# Patient Record
Sex: Female | Born: 1955 | Race: White | Hispanic: No | Marital: Single | Smoking: Former smoker
Health system: Southern US, Community
[De-identification: ages and names within clinical notes are randomized; demographics above are authoritative.]

## PROBLEM LIST (undated history)

## (undated) DIAGNOSIS — Z86010 Personal history of colonic polyps: Secondary | ICD-10-CM

## (undated) DIAGNOSIS — A6000 Herpesviral infection of urogenital system, unspecified: Secondary | ICD-10-CM

## (undated) DIAGNOSIS — M1990 Unspecified osteoarthritis, unspecified site: Secondary | ICD-10-CM

## (undated) DIAGNOSIS — D219 Benign neoplasm of connective and other soft tissue, unspecified: Secondary | ICD-10-CM

## (undated) DIAGNOSIS — Z9889 Other specified postprocedural states: Secondary | ICD-10-CM

## (undated) DIAGNOSIS — K219 Gastro-esophageal reflux disease without esophagitis: Secondary | ICD-10-CM

## (undated) HISTORY — PX: COLONOSCOPY: SHX174

## (undated) HISTORY — DX: Other specified postprocedural states: Z98.890

## (undated) HISTORY — DX: Benign neoplasm of connective and other soft tissue, unspecified: D21.9

## (undated) HISTORY — PX: OTHER SURGICAL HISTORY: SHX169

## (undated) HISTORY — DX: Gastro-esophageal reflux disease without esophagitis: K21.9

## (undated) HISTORY — DX: Herpesviral infection of urogenital system, unspecified: A60.00

## (undated) HISTORY — DX: Unspecified osteoarthritis, unspecified site: M19.90

## (undated) HISTORY — DX: Personal history of colonic polyps: Z86.010

---

## 1990-05-05 HISTORY — PX: OTHER SURGICAL HISTORY: SHX169

## 2005-08-20 ENCOUNTER — Other Ambulatory Visit: Admission: RE | Admit: 2005-08-20 | Discharge: 2005-08-20 | Payer: Self-pay | Admitting: Obstetrics and Gynecology

## 2006-05-05 DIAGNOSIS — Z86010 Personal history of colonic polyps: Secondary | ICD-10-CM

## 2006-05-05 DIAGNOSIS — Z9889 Other specified postprocedural states: Secondary | ICD-10-CM

## 2006-05-05 HISTORY — DX: Other specified postprocedural states: Z98.890

## 2006-05-05 HISTORY — DX: Personal history of colonic polyps: Z86.010

## 2006-07-06 ENCOUNTER — Encounter: Admission: RE | Admit: 2006-07-06 | Discharge: 2006-07-06 | Payer: Self-pay | Admitting: Obstetrics and Gynecology

## 2006-08-24 ENCOUNTER — Other Ambulatory Visit: Admission: RE | Admit: 2006-08-24 | Discharge: 2006-08-24 | Payer: Self-pay | Admitting: Obstetrics and Gynecology

## 2007-09-16 ENCOUNTER — Other Ambulatory Visit: Admission: RE | Admit: 2007-09-16 | Discharge: 2007-09-16 | Payer: Self-pay | Admitting: Gynecology

## 2008-05-05 HISTORY — PX: STAPEDES SURGERY: SHX789

## 2008-09-12 ENCOUNTER — Encounter: Admission: RE | Admit: 2008-09-12 | Discharge: 2008-09-12 | Payer: Self-pay | Admitting: Family Medicine

## 2009-06-21 ENCOUNTER — Other Ambulatory Visit: Admission: RE | Admit: 2009-06-21 | Discharge: 2009-06-21 | Payer: Self-pay | Admitting: Gynecology

## 2009-06-21 ENCOUNTER — Ambulatory Visit: Payer: Self-pay | Admitting: Women's Health

## 2009-06-26 ENCOUNTER — Encounter: Admission: RE | Admit: 2009-06-26 | Discharge: 2009-06-26 | Payer: Self-pay | Admitting: Obstetrics and Gynecology

## 2009-07-03 ENCOUNTER — Encounter: Payer: Self-pay | Admitting: Internal Medicine

## 2009-07-03 LAB — CONVERTED CEMR LAB

## 2009-07-30 ENCOUNTER — Ambulatory Visit: Payer: Self-pay | Admitting: Women's Health

## 2009-07-30 ENCOUNTER — Other Ambulatory Visit: Admission: RE | Admit: 2009-07-30 | Discharge: 2009-07-30 | Payer: Self-pay | Admitting: Gynecology

## 2009-10-29 ENCOUNTER — Ambulatory Visit: Payer: Self-pay | Admitting: Gynecology

## 2010-04-12 ENCOUNTER — Ambulatory Visit: Payer: Self-pay | Admitting: Women's Health

## 2010-05-31 ENCOUNTER — Ambulatory Visit
Admission: RE | Admit: 2010-05-31 | Discharge: 2010-05-31 | Payer: Self-pay | Source: Home / Self Care | Attending: Women's Health | Admitting: Women's Health

## 2010-06-05 ENCOUNTER — Ambulatory Visit (INDEPENDENT_AMBULATORY_CARE_PROVIDER_SITE_OTHER): Payer: BC Managed Care – PPO | Admitting: Internal Medicine

## 2010-06-05 ENCOUNTER — Encounter: Payer: Self-pay | Admitting: Internal Medicine

## 2010-06-05 DIAGNOSIS — M545 Low back pain, unspecified: Secondary | ICD-10-CM

## 2010-06-05 DIAGNOSIS — B009 Herpesviral infection, unspecified: Secondary | ICD-10-CM

## 2010-06-05 DIAGNOSIS — K219 Gastro-esophageal reflux disease without esophagitis: Secondary | ICD-10-CM | POA: Insufficient documentation

## 2010-06-05 DIAGNOSIS — Z9189 Other specified personal risk factors, not elsewhere classified: Secondary | ICD-10-CM | POA: Insufficient documentation

## 2010-06-12 NOTE — Assessment & Plan Note (Signed)
Summary: new pt/bcbs/#/lb   Vital Signs:  Patient profile:   56 year old female Height:      64.5 inches (163.83 cm) Weight:      156.12 pounds (70.96 kg) BMI:     26.48 O2 Sat:      97 % on Room air Temp:     97.8 degrees F (36.56 degrees C) oral Pulse rate:   67 / minute BP sitting:   122 / 70  (left arm) Cuff size:   regular  Vitals Entered By: Orlan Leavens RMA (June 05, 2010 2:01 PM)  O2 Flow:  Room air CC: New patient, Back pain Is Patient Diabetic? No Pain Assessment Patient in pain? no        Primary Care Provider:  Newt Lukes MD  CC:  New patient and Back pain.  History of Present Illness: new pt to me and our practice, here to est care - prev with dr. bland   Back Pain      This is a 55 year old woman who presents with Back pain.  The symptoms began 1 week ago.  The intensity is described as moderate.  no injury or trauma precipitating pain. hx same related.  The patient denies fever, chills, weakness, loss of sensation, fecal incontinence, urinary incontinence, dysuria, rest pain, inability to work, and inability to care for self.  The pain is located in the right low back.  The pain began at work and suddenly.  The pain radiates to the right buttock.  The pain is made worse by lying down.  The pain is made better by activity, NSAID medications, and heat.    Preventive Screening-Counseling & Management  Alcohol-Tobacco     Alcohol drinks/day: <1     Alcohol Counseling: not indicated; use of alcohol is not excessive or problematic     Smoking Status: quit     Tobacco Counseling: to remain off tobacco products  Caffeine-Diet-Exercise     Does Patient Exercise: no     Exercise Counseling: to improve exercise regimen     Depression Counseling: not indicated; screening negative for depression  Safety-Violence-Falls     Seat Belt Counseling: not indicated; patient wears seat belts     Firearm Counseling: not applicable     Violence Counseling: not  indicated; no violence risk noted     Fall Risk Counseling: not indicated; no significant falls noted  Clinical Review Panels:  Prevention   Last Mammogram:  Location: [facility].   Impression: 1. Probable post-biopsy scarring in the  left breast 2:30 position 5cm from the nipple identified on U/S. A diagnostic left mammogram and left breast u/s in 6 months is recommend to confirm this benign impression: 2. No evidence of malignacy is identified BI-RADS 3 (06/26/2009)   Last Pap Smear:  Interpretation Result:Negative for intraepithelial Lesion or Malignancy.    (07/03/2009)   Current Medications (verified): 1)  Valtrex 500 Mg Tabs (Valacyclovir Hcl) .... Take 1 By Mouth Two Times A Day  Allergies (verified): 1)  ! Codeine  Past History:  Past Medical History: GERD HSV2  MD roster: gyn - fernandez/young  Past Surgical History: Caesarean section [phone]) Breast biopsy (2005) Broken (L) leg put rods & pins in  Family History: Family History Diabetes 1st degree relative (grandmother) Family History Hypertension (mom) Family History of Stroke M 1st degree relative <50 (grandfather) Family History High cholesterol (sister) Heart disease (both parent)   Social History: Former Smoker - quit  1992 single, lives with dtr originally from [place]   Smoking Status:  quit Does Patient Exercise:  no  Review of Systems  The patient denies abdominal pain, hematuria, genital sores, muscle weakness, suspicious skin lesions, difficulty walking, and depression.    Physical Exam  General:  alert, well-developed, well-nourished, and cooperative to examination.    Lungs:  normal respiratory effort, no intercostal retractions or use of accessory muscles; normal breath sounds bilaterally - no crackles and no wheezes.    Heart:  normal rate, regular rhythm, no murmur, and no rub. BLE without edema.  Msk:  back: full range of motion of lumbar spine. Nontender to  palpation. Negative straight leg raise. Deep tendon reflexes symmetrically intactand sensation intact throughout all dermatomes in bilateral lower extremities. Full strength to manual muscle testing.  Able to heel and toe walk without difficulty and ambulates with a normal gait.    Impression & Recommendations:  Problem # 1:  BACK PAIN, LUMBAR (ICD-724.2)  suspect muscle starin - improving -  cont otc nsaid but take scheduled for next 5 days - to call for xray or rx med if not improved, sooner if worse (declines imaging or other meds today since better than onset last week) Her updated medication list for this problem includes:    Ibuprofen 200 Mg Tabs (Ibuprofen) .Marland Kitchen... 2 by mouth two times a day x 5 days, then as needed for pain  Discussed use of moist heat or ice, modified activities, medications, and stretching/strengthening exercises. Back care instructions given. To be seen in 2 weeks if no improvement; sooner if worsening of symptoms.   Problem # 2:  HSV (ICD-054.9)  valtrex as needed as per gyn - no current or recent outbreaks/flares  Complete Medication List: 1)  Valtrex 500 Mg Tabs (Valacyclovir hcl) .... Take 1 by mouth two times a day 2)  Ibuprofen 200 Mg Tabs (Ibuprofen) .... 2 by mouth two times a day x 5 days, then as needed for pain  Patient Instructions: 1)  it was good to see you today. 2)  will send for records from dr. bland to review - 3)  use over the counter ibuprofen for back strain as discussed - 4)  call if pain not improved in next 10-14days, sooner if worse 5)  Please schedule a follow-up appointment in next 2-6 months for physical and labs, call sooner if problems.    Orders Added: 1)  New Patient Level II [99202]     Pap Smear  Procedure date:  07/03/2009  Findings:      Interpretation Result:Negative for intraepithelial Lesion or Malignancy.

## 2010-07-03 ENCOUNTER — Ambulatory Visit (INDEPENDENT_AMBULATORY_CARE_PROVIDER_SITE_OTHER): Payer: Self-pay

## 2010-07-03 DIAGNOSIS — K5900 Constipation, unspecified: Secondary | ICD-10-CM

## 2010-07-17 ENCOUNTER — Ambulatory Visit (INDEPENDENT_AMBULATORY_CARE_PROVIDER_SITE_OTHER): Payer: Self-pay

## 2010-07-17 DIAGNOSIS — K5900 Constipation, unspecified: Secondary | ICD-10-CM

## 2010-08-14 ENCOUNTER — Ambulatory Visit (INDEPENDENT_AMBULATORY_CARE_PROVIDER_SITE_OTHER): Payer: Self-pay

## 2010-08-14 DIAGNOSIS — K5900 Constipation, unspecified: Secondary | ICD-10-CM

## 2010-09-11 ENCOUNTER — Ambulatory Visit (INDEPENDENT_AMBULATORY_CARE_PROVIDER_SITE_OTHER): Payer: Self-pay

## 2010-09-11 ENCOUNTER — Ambulatory Visit: Payer: Self-pay

## 2010-09-11 DIAGNOSIS — K5900 Constipation, unspecified: Secondary | ICD-10-CM

## 2010-10-09 ENCOUNTER — Ambulatory Visit (INDEPENDENT_AMBULATORY_CARE_PROVIDER_SITE_OTHER): Payer: Self-pay

## 2010-10-09 DIAGNOSIS — K5900 Constipation, unspecified: Secondary | ICD-10-CM

## 2010-10-22 ENCOUNTER — Ambulatory Visit (INDEPENDENT_AMBULATORY_CARE_PROVIDER_SITE_OTHER): Payer: Self-pay

## 2010-10-22 DIAGNOSIS — K5900 Constipation, unspecified: Secondary | ICD-10-CM

## 2010-11-21 ENCOUNTER — Encounter: Payer: Self-pay | Admitting: *Deleted

## 2010-11-27 ENCOUNTER — Other Ambulatory Visit (HOSPITAL_COMMUNITY)
Admission: RE | Admit: 2010-11-27 | Discharge: 2010-11-27 | Disposition: A | Discharge status: Home / Self Care | Payer: BC Managed Care – PPO | Source: Ambulatory Visit | Attending: Gynecology | Admitting: Gynecology

## 2010-11-27 ENCOUNTER — Encounter: Payer: Self-pay | Admitting: Women's Health

## 2010-11-27 ENCOUNTER — Ambulatory Visit (INDEPENDENT_AMBULATORY_CARE_PROVIDER_SITE_OTHER): Payer: BC Managed Care – PPO | Admitting: Women's Health

## 2010-11-27 VITALS — BP 130/70 | Ht 65.25 in | Wt 156.0 lb

## 2010-11-27 DIAGNOSIS — Z01419 Encounter for gynecological examination (general) (routine) without abnormal findings: Secondary | ICD-10-CM

## 2010-11-27 DIAGNOSIS — N951 Menopausal and female climacteric states: Secondary | ICD-10-CM

## 2010-11-27 NOTE — Progress Notes (Signed)
Samantha Webb, has this patient been scheduled for colonoscopy? FOBT at office visit?

## 2010-11-27 NOTE — Progress Notes (Addendum)
Samantha Webb 02/24/1956 [id number]    History:    The patient presents for annual exam.  55 year old presents for annual exam without complaint. She has been without a period now for greater than 7 months. With an elevated FSH. Past surgical , medical  and social history were all reviewed and documented in the EPIC chart.   ROS:  A 14 point ROS was performed and pertinent positives and negatives are included in the history.  Exam:  Filed Vitals:   11/27/10 1012  BP: 130/70    General appearance:  Normal Head/Neck:  Normal, without cervical or supraclavicular adenopathy. Thyroid:  Symmetrical, normal in size, without palpable masses or nodularity. Respiratory  Effort:  Normal  Auscultation:  Clear without wheezing or rhonchi Cardiovascular  Auscultation:  Regular rate, without rubs, murmurs or gallops  Edema/varicosities:  Not grossly evident Abdominal  Masses/tenderness:  Soft,nontender, without masses, guarding or rebound.  Liver/spleen:  No organomegaly noted  Hernia:  None appreciated  Occult test:   Skin  Inspection:  Grossly normal  Palpation:  Grossly normal Neurologic/psychiatric  Orientation:  Normal with appropriate conversation.  Mood/affect:  Normal  Genitourinary with chaperone present    Breasts: Examined lying and sitting.     Right: Without masses, retractions,         discharge or axillary adenopathy.     Left: Without masses, retractions,         discharge or axillary adenopathy.   Inguinal/mons:  Normal without inguinal adenopathy  External genitalia:  Normal  BUS/Urethra/Skene's glands:  Normal  Bladder:  Normal  Vagina:  Normal  Cervix:  Normal  Uterus:  retroverted, enlarged in size/fibroids to 10 wk size.  Midline and mobile  Adnexa/parametria:     Rt: Without masses or tenderness.   Lt: Without masses or tenderness.  Anus and perineum: Normal  Digital rectal exam: Normal sphincter tone without palpated masses or        tenderness.   Assessment/Plan:  55 y.o. year old female for annual exam.   Postmenopausal with some symptoms of hot flushes. She declines HRT therapy did review slight risk for blood clots strokes rest cancer with HRT. She declines need at this time. Will schedule a bone density here in our office. Encouraged to continue calcium rich foods vitamin D 2000 daily encouraged. She is doing Pilates and regular exercise and will continue. SBE is encouraged report changes. She has a mammogram scheduled next week and she will keep. Mammogram is overdue and she is aware of the importance of annual screen. She has had numerous labs at her primary care and she states her glucose lipid profile CBC were all normal. UA and Pap only today history of all normal Paps. Had a colonoscopy in 2008 when 51 with one benign polyp.  Also participated in constipation study this year, had neg blood in stool and all normal labs.  Was instructed to increase fluids and fiber rich foods.      Harrington Challenger MD, 11:16 AM 11/27/2010

## 2010-11-29 ENCOUNTER — Encounter: Payer: Self-pay | Admitting: Women's Health

## 2011-02-17 ENCOUNTER — Other Ambulatory Visit: Payer: Self-pay | Admitting: *Deleted

## 2011-02-17 DIAGNOSIS — L905 Scar conditions and fibrosis of skin: Secondary | ICD-10-CM

## 2011-03-03 ENCOUNTER — Other Ambulatory Visit: Payer: BC Managed Care – PPO

## 2011-03-05 ENCOUNTER — Ambulatory Visit
Admission: RE | Admit: 2011-03-05 | Discharge: 2011-03-05 | Disposition: A | Discharge status: Home / Self Care | Payer: BC Managed Care – PPO | Source: Ambulatory Visit | Attending: Obstetrics and Gynecology | Admitting: Obstetrics and Gynecology

## 2011-03-05 DIAGNOSIS — L905 Scar conditions and fibrosis of skin: Secondary | ICD-10-CM

## 2011-05-02 ENCOUNTER — Encounter: Payer: Self-pay | Admitting: Internal Medicine

## 2011-05-02 ENCOUNTER — Ambulatory Visit (INDEPENDENT_AMBULATORY_CARE_PROVIDER_SITE_OTHER): Payer: BC Managed Care – PPO | Admitting: Internal Medicine

## 2011-05-02 VITALS — BP 110/82 | HR 65 | Temp 98.7°F

## 2011-05-02 DIAGNOSIS — B351 Tinea unguium: Secondary | ICD-10-CM

## 2011-05-02 MED ORDER — CICLOPIROX 8 % EX SOLN: Freq: Every day | CUTANEOUS | Status: DC

## 2011-05-02 NOTE — Patient Instructions (Signed)
It was good to see you today. Try Penlac polish for your nail condition - also use tea tree oil and give it time! Your prescription(s) have been submitted to your pharmacy. Please take as directed and contact our office if you believe you are having problem(s) with the medication(s). If still problems, call a podiatrist for consideration of nail removal

## 2011-05-02 NOTE — Progress Notes (Signed)
  Subjective:    Patient ID: Samantha Webb, female    DOB: 12-Oct-1955, 55 y.o.   MRN: [id number]  HPI complains of toenail fungus Ongoing greater than one year Home treatment with applesauce or vinegar soaks have been partially effective no pain, no trauma or drainage  Past Medical History  Diagnosis Date  . Fibroid   . History of colonoscopy with polypectomy 2008    benign polyp  . Herpesvirus 2   . GERD (gastroesophageal reflux disease)      Review of Systems Musculoskeletal: no joint swelling Skin: No rash or bruising    Objective:   Physical Exam BP 110/82  Pulse 65  Temp(Src) 98.7 F (37.1 C) (Oral)  SpO2 97% General: No acute distress Skin: Left great tunnel with fungal changes on medial edge including partial nail removal not involving base - no evidence of ingrown nail or skin infection - other nails unaffected  No results found for this basename: WBC, HGB, HCT, PLT, GLUCOSE, CHOL, TRIG, HDL, LDLDIRECT, LDLCALC, ALT, AST, NA, K, CL, CREATININE, BUN, CO2, TSH, PSA, INR, GLUF, HGBA1C, MICROALBUR        Assessment & Plan:  Onychomycosis, left great nail - discussed risk and benefit of systemic antifungal and instead will try topical therapies - Penlac and tea tree oil - education on diagnosis provided. If continued symptoms over next 12-24 months which patient desires resolution, will call podiatrist for consideration of partial nail removal

## 2011-08-29 ENCOUNTER — Encounter: Payer: Self-pay | Admitting: Internal Medicine

## 2011-08-29 ENCOUNTER — Ambulatory Visit (INDEPENDENT_AMBULATORY_CARE_PROVIDER_SITE_OTHER): Payer: BC Managed Care – PPO | Admitting: Internal Medicine

## 2011-08-29 ENCOUNTER — Other Ambulatory Visit (INDEPENDENT_AMBULATORY_CARE_PROVIDER_SITE_OTHER): Payer: BC Managed Care – PPO

## 2011-08-29 VITALS — BP 112/72 | HR 66 | Temp 97.6°F | Ht 64.5 in | Wt 163.8 lb

## 2011-08-29 DIAGNOSIS — R109 Unspecified abdominal pain: Secondary | ICD-10-CM

## 2011-08-29 DIAGNOSIS — R1011 Right upper quadrant pain: Secondary | ICD-10-CM

## 2011-08-29 DIAGNOSIS — M545 Low back pain, unspecified: Secondary | ICD-10-CM

## 2011-08-29 LAB — CBC WITH DIFFERENTIAL/PLATELET
HCT: 37.3 % (ref 36.0–46.0)
Hemoglobin: 12.6 g/dL (ref 12.0–15.0)
MCV: 89.7 fl (ref 78.0–100.0)
Neutro Abs: 4.6 10*3/uL (ref 1.4–7.7)
Neutrophils Relative %: 73.3 % (ref 43.0–77.0)
Platelets: 163 10*3/uL (ref 150.0–400.0)

## 2011-08-29 LAB — URINALYSIS, ROUTINE W REFLEX MICROSCOPIC
Leukocytes, UA: NEGATIVE
Nitrite: NEGATIVE
Specific Gravity, Urine: <=1.005 (ref 1.000–1.030)
Urine Glucose: NEGATIVE
Urobilinogen, UA: 0.2 (ref 0.0–1.0)

## 2011-08-29 LAB — BASIC METABOLIC PANEL
BUN: 14 mg/dL (ref 6–23)
CO2: 28 mEq/L (ref 19–32)
GFR: 100.78 mL/min (ref >60.00)
Potassium: 3.5 mEq/L (ref 3.5–5.1)

## 2011-08-29 LAB — HEPATIC FUNCTION PANEL
AST: 23 U/L (ref 0–37)
Alkaline Phosphatase: 72 U/L (ref 39–117)
Bilirubin, Direct: 0.1 mg/dL (ref 0.0–0.3)
Total Bilirubin: 0.4 mg/dL (ref 0.3–1.2)
Total Protein: 7 g/dL (ref 6.0–8.3)

## 2011-08-29 MED ORDER — CYCLOBENZAPRINE HCL 5 MG PO TABS: 5.0000 mg | ORAL_TABLET | Freq: Three times a day (TID) | ORAL | Status: AC | PRN

## 2011-08-29 NOTE — Patient Instructions (Signed)
Take all new medications as prescribed Continue all other medications as before Please go to LAB in the Basement for the blood and/or urine tests to be done today You will be contacted regarding the referral for: ultrasound You will be contacted by phone if any changes need to be made immediately.  Otherwise, you will receive a letter about your results with an explanation.

## 2011-08-30 ENCOUNTER — Encounter: Payer: Self-pay | Admitting: Internal Medicine

## 2011-08-30 NOTE — Assessment & Plan Note (Addendum)
Cant r/o GB issue - for abd u/s and labs,  to f/u any worsening symptoms or concerns

## 2011-08-30 NOTE — Assessment & Plan Note (Signed)
Incidental recurrent, but suspect separate issue, and stable,  to f/u any worsening symptoms or concerns

## 2011-08-30 NOTE — Assessment & Plan Note (Signed)
Cant r/o GB but suspect posible MSK - for flexeril prn

## 2011-08-30 NOTE — Progress Notes (Signed)
  Subjective:    Patient ID: Samantha Webb, female    DOB: [birth year], 56 y.o.   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  Did also have an episode of nausea last wk with dizziness 2 days ago but none since and Denies worsening reflux, dysphagia, other abd pain, vomiting, bowel change or blood.  Pt denies other chest pain, increased sob or doe, wheezing, orthopnea, PND, increased LE swelling, palpitations, dizziness or syncope.  Is s/p LLE pins/rods surgury after accident with chronic mild swelling but no worse.  Pt denies new neurological symptoms such as new headache, or facial or extremity weakness or numbness   Pt denies polydipsia, polyuria. Does incidnetly have also recurrent right lower back pain without change in severity or freq but has occas RLE distal pain and numbness, no weakness, bowel or bladder change, fever, wt loss, gait change or falls. Past Medical History  Diagnosis Date  . Fibroid   . History of colonoscopy with polypectomy 2008    benign polyp  . Herpesvirus 2   . GERD (gastroesophageal reflux disease)    Past Surgical History  Procedure Date  . Fractured leg surgery   . Cesarean section 1992  . Umbillical hernia  1992  . Stapedes surgery 2010    reports that she has never smoked. She has never used smokeless tobacco. She reports that she drinks alcohol. She reports that she does not use illicit drugs. family history includes Diabetes in her paternal grandmother; Heart disease in her father and mother; and Hypertension in her mother. Allergies  Allergen Reactions  . Codeine     REACTION: Nausea   No current outpatient prescriptions on file prior to visit.   Review of Systems Constitutional: Negative for diaphoresis and  unexpected weight change.  HENT: Negative for tinnitus.   Eyes: Negative for photophobia and visual disturbance.  Respiratory: Negative for cough or ST Gastrointestinal: Negative for vomiting and blood in stool.  Genitourinary: Negative for hematuria and decreased urine volume.  Musculoskeletal: Negative for gait problem.  Skin: Negative for color change and wound.  Neurological: Negative for tremors and numbness.    Objective:   Physical Exam BP 112/72  Pulse 66  Temp(Src) 97.6 F (36.4 C) (Oral)  Ht 5' 4.5" (1.638 m)  Wt 163 lb 12 oz (74.277 kg)  BMI 27.67 kg/m2  SpO2 97% Physical Exam  VS noted, not ill appaering Constitutional: Pt appears well-developed and well-nourished.  HENT: Head: Normocephalic.  Right Ear: External ear normal.  Left Ear: External ear normal.  Eyes: Conjunctivae and EOM are normal. Pupils are equal, round, and reactive to light.  Neck: Normal range of motion. Neck supple.  Cardiovascular: Normal rate and regular rhythm.   Pulmonary/Chest: Effort normal and breath sounds normal.  Abd:  Soft, NT, non-distended, + BS - benign exam Neurological: Pt is alert. Not confused, LE's normal motor/dtr/gait intact Skin: Skin is warm. No erythema.  Psychiatric: Pt behavior is normal. 1+ nervous    Assessment & Plan:

## 2011-08-31 ENCOUNTER — Encounter: Payer: Self-pay | Admitting: Internal Medicine

## 2011-09-01 ENCOUNTER — Ambulatory Visit: Payer: BC Managed Care – PPO | Admitting: Internal Medicine

## 2011-09-01 ENCOUNTER — Telehealth: Payer: Self-pay | Admitting: Internal Medicine

## 2011-09-01 NOTE — Telephone Encounter (Signed)
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

## 2011-09-17 ENCOUNTER — Ambulatory Visit
Admission: RE | Admit: 2011-09-17 | Discharge: 2011-09-17 | Disposition: A | Discharge status: Home / Self Care | Payer: BC Managed Care – PPO | Source: Ambulatory Visit | Attending: Internal Medicine | Admitting: Internal Medicine

## 2011-09-17 ENCOUNTER — Encounter: Payer: Self-pay | Admitting: Internal Medicine

## 2011-09-17 ENCOUNTER — Telehealth: Payer: Self-pay | Admitting: *Deleted

## 2011-09-17 ENCOUNTER — Other Ambulatory Visit: Payer: Self-pay | Admitting: Internal Medicine

## 2011-09-17 DIAGNOSIS — R1011 Right upper quadrant pain: Secondary | ICD-10-CM

## 2011-09-17 DIAGNOSIS — Q453 Other congenital malformations of pancreas and pancreatic duct: Secondary | ICD-10-CM

## 2011-09-17 DIAGNOSIS — R109 Unspecified abdominal pain: Secondary | ICD-10-CM

## 2011-09-17 DIAGNOSIS — K769 Liver disease, unspecified: Secondary | ICD-10-CM | POA: Insufficient documentation

## 2011-09-17 NOTE — Telephone Encounter (Signed)
ultrasound results noted and reviewed - no specific abnormality to explain pt pain symptoms -  no tx change recommended -  if persisting or recurrent pain, please schedule ROV to review symptoms and next step options

## 2011-09-17 NOTE — Telephone Encounter (Signed)
Called pt no answer LMOM RTC.... 09/18/11@1 :13pm/LMB

## 2011-09-17 NOTE — Telephone Encounter (Signed)
Call Report from [facility] of Abd U/S- mild dilation of pancreatic duct; question small hemangioma left lobe of liver.

## 2011-09-17 NOTE — Telephone Encounter (Signed)
Notified pt with md response... 09/16/1320:36pm/LMB

## 2011-09-17 NOTE — Telephone Encounter (Signed)
Left msg on vm went to have u/s that was ordered by Dr. Jonny Ruiz this am. Want to make sure that Dr. Felicity Coyer received the results.... 09/18/11@9 :28am/LMB

## 2011-10-09 ENCOUNTER — Telehealth: Payer: Self-pay | Admitting: *Deleted

## 2011-10-09 MED ORDER — VALACYCLOVIR HCL 500 MG PO TABS: 500.0000 mg | ORAL_TABLET | Freq: Two times a day (BID) | ORAL | Status: DC

## 2011-10-09 NOTE — Telephone Encounter (Signed)
Pt called requesting new rx on her valtrex 500 mg tablets for hsv outbreaks, rx sent. Pt only takes as needed, rx had expired.

## 2011-11-05 ENCOUNTER — Other Ambulatory Visit: Payer: Self-pay

## 2011-11-05 MED ORDER — CICLOPIROX 8 % EX SOLN: Freq: Every day | CUTANEOUS | Status: DC

## 2011-12-16 ENCOUNTER — Ambulatory Visit (INDEPENDENT_AMBULATORY_CARE_PROVIDER_SITE_OTHER)
Admission: RE | Admit: 2011-12-16 | Discharge: 2011-12-16 | Disposition: A | Discharge status: Home / Self Care | Payer: BC Managed Care – PPO | Source: Ambulatory Visit | Attending: Internal Medicine | Admitting: Internal Medicine

## 2011-12-16 ENCOUNTER — Ambulatory Visit (INDEPENDENT_AMBULATORY_CARE_PROVIDER_SITE_OTHER): Payer: BC Managed Care – PPO | Admitting: Internal Medicine

## 2011-12-16 ENCOUNTER — Encounter: Payer: Self-pay | Admitting: Internal Medicine

## 2011-12-16 VITALS — BP 118/70 | HR 63 | Temp 98.6°F | Ht 64.5 in | Wt 156.0 lb

## 2011-12-16 DIAGNOSIS — D239 Other benign neoplasm of skin, unspecified: Secondary | ICD-10-CM

## 2011-12-16 DIAGNOSIS — D229 Melanocytic nevi, unspecified: Secondary | ICD-10-CM

## 2011-12-16 DIAGNOSIS — R059 Cough, unspecified: Secondary | ICD-10-CM

## 2011-12-16 DIAGNOSIS — R05 Cough: Secondary | ICD-10-CM

## 2011-12-16 NOTE — Progress Notes (Signed)
  Subjective:    Patient ID: Samantha Webb, female    DOB: 1956/04/22, 56 y.o.   MRN: [id number]  HPI  complains of change in skin mole Increasing irritation from bra  No bleeding - ?enlarged size Also ?increase mole on R neck  complains of cough - dry but "nagging" Worse at night Ongoing >6weeks associated with weight loss ( but intentional) Denies SSCP, hemoptysis or shortness of breath   Past Medical History  Diagnosis Date  . Fibroid   . History of colonoscopy with polypectomy 2008    benign polyp  . Herpesvirus 2   . GERD (gastroesophageal reflux disease)     Review of Systems  Constitutional: Negative for fever, chills and fatigue.  Respiratory: Negative for choking and wheezing.   Gastrointestinal: Negative for abdominal pain.  Skin: Negative for pallor and rash.  Neurological: Negative for headaches.       Objective:   Physical Exam BP 118/70  Pulse 63  Temp 98.6 F (37 C) (Oral)  Ht 5' 4.5" (1.638 m)  Wt 156 lb (70.761 kg)  BMI 26.36 kg/m2  SpO2 97% Wt Readings from Last 3 Encounters:  12/16/11 156 lb (70.761 kg)  08/29/11 163 lb 12 oz (74.277 kg)  11/27/10 156 lb (70.761 kg)   Constitutional: She appears well-developed and well-nourished. No distress.  Neck: Normal range of motion. Neck supple. No JVD present. No thyromegaly present.  Cardiovascular: Normal rate, regular rhythm and normal heart sounds.  No murmur heard. No BLE edema. Pulmonary/Chest: Effort normal and breath sounds normal. No respiratory distress. She has no wheezes.  Skin: mole R lateral neck, small skin tags posterior back - cherry angiomas -  Skin is warm and dry. No rash noted. No erythema.  Psychiatric: She has a normal mood and affect. Her behavior is normal. Judgment and thought content normal.   Lab Results  Component Value Date   WBC 6.2 08/29/2011   HGB 12.6 08/29/2011   HCT 37.3 08/29/2011   PLT 163.0 08/29/2011   GLUCOSE 95 08/29/2011   ALT 17 08/29/2011   AST 23  08/29/2011   NA 139 08/29/2011   K 3.5 08/29/2011   CL 103 08/29/2011   CREATININE 0.7 08/29/2011   BUN 14 08/29/2011   CO2 28 08/29/2011      Assessment & Plan:  Change in skin mole - refer to derm - may consider cosmetic removal  Cough - mild but ongoing > 6 weeks - check CXR

## 2011-12-16 NOTE — Patient Instructions (Signed)
It was good to see you today. we'll make referral to dermatologist for your change in skin moles . Our office will contact you regarding appointment(s) once made. Test(s) ordered today. Your results will be called to you after review (48-72hours after test completion). If any changes need to be made, you will be notified at that time. Alternate between ibuprofen and tylenol for aches, pain or fever symptoms as discussed

## 2012-04-09 ENCOUNTER — Other Ambulatory Visit: Payer: Self-pay | Admitting: Obstetrics and Gynecology

## 2012-04-09 DIAGNOSIS — Z1231 Encounter for screening mammogram for malignant neoplasm of breast: Secondary | ICD-10-CM

## 2012-05-07 ENCOUNTER — Ambulatory Visit
Admission: RE | Admit: 2012-05-07 | Discharge: 2012-05-07 | Disposition: A | Discharge status: Home / Self Care | Payer: BC Managed Care – PPO | Source: Ambulatory Visit | Attending: Obstetrics and Gynecology | Admitting: Obstetrics and Gynecology

## 2012-05-07 DIAGNOSIS — Z1231 Encounter for screening mammogram for malignant neoplasm of breast: Secondary | ICD-10-CM

## 2012-05-13 ENCOUNTER — Telehealth: Payer: Self-pay | Admitting: Internal Medicine

## 2012-05-13 ENCOUNTER — Ambulatory Visit (INDEPENDENT_AMBULATORY_CARE_PROVIDER_SITE_OTHER): Payer: BC Managed Care – PPO | Admitting: Internal Medicine

## 2012-05-13 ENCOUNTER — Encounter: Payer: Self-pay | Admitting: Internal Medicine

## 2012-05-13 VITALS — BP 122/82 | HR 77 | Temp 97.5°F | Ht 64.5 in | Wt 160.2 lb

## 2012-05-13 DIAGNOSIS — S29011A Strain of muscle and tendon of front wall of thorax, initial encounter: Secondary | ICD-10-CM

## 2012-05-13 DIAGNOSIS — R079 Chest pain, unspecified: Secondary | ICD-10-CM

## 2012-05-13 DIAGNOSIS — IMO0002 Reserved for concepts with insufficient information to code with codable children: Secondary | ICD-10-CM

## 2012-05-13 MED ORDER — DICLOFENAC SODIUM 75 MG PO TBEC: 75.0000 mg | DELAYED_RELEASE_TABLET | Freq: Two times a day (BID) | ORAL | Status: DC

## 2012-05-13 MED ORDER — CYCLOBENZAPRINE HCL 10 MG PO TABS: 10.0000 mg | ORAL_TABLET | Freq: Three times a day (TID) | ORAL | Status: DC | PRN

## 2012-05-13 NOTE — Patient Instructions (Signed)
Muscle Strain °Muscle strain occurs when a muscle is stretched beyond its normal length. A small number of muscle fibers generally are torn. This is especially common in athletes. This happens when a sudden, violent force placed on a muscle stretches it too far. Usually, recovery from muscle strain takes 1 to 2 weeks. Complete healing will take 5 to 6 weeks.  °HOME CARE INSTRUCTIONS  °· While awake, apply ice to the sore muscle for the first 2 days after the injury. °· Put ice in a plastic bag. °· Place a towel between your skin and the bag. °· Leave the ice on for 15 to 20 minutes each hour. °· Do not use the strained muscle for several days, until you no longer have pain. °· You may wrap the injured area with an elastic bandage for comfort. Be careful not to wrap it too tightly. This may interfere with blood circulation or increase swelling. °· Only take over-the-counter or prescription medicines for pain, discomfort, or fever as directed by your caregiver. °SEEK MEDICAL CARE IF:  °You have increasing pain or swelling in the injured area. °MAKE SURE YOU:  °· Understand these instructions. °· Will watch your condition. °· Will get help right away if you are not doing well or get worse. °Document Released: 04/21/2005 Document Revised: 07/14/2011 Document Reviewed: 05/03/2011 °ExitCare® Patient Information ©2013 [employer]. ° °

## 2012-05-13 NOTE — Telephone Encounter (Signed)
Patient Information:  Caller Name: Samantha Webb  Phone: [phone]  Patient: Samantha Webb  Gender: Female  DOB: December 06, 1955  Age: 57 Years  PCP: Rene Paci (Adults only)  Pregnant: No  Office Follow Up:  Does the office need to follow up with this patient?: No  Instructions For The Office: N/A  RN Note:  Advised patient she should be seen today and not wait until tomorrow to be seen by the doctor. Cancelled appointment that was scheduled with Dr. Felicity Coyer for 05/14/12.  Symptoms  Reason For Call & Symptoms: Reports chest tightness at times. Has already scheduled an appointment with Dr. Felicity Coyer at 10:30 on 05/14/12.  Reviewed Health History In EMR: Yes  Reviewed Medications In EMR: Yes  Reviewed Allergies In EMR: Yes  Reviewed Surgeries / Procedures: Yes  Date of Onset of Symptoms: 04/12/2012  Treatments Tried: Aspirin 243mg  daily  Treatments Tried Worked: Yes OB / GYN:  LMP: Unknown  Guideline(s) Used:  Chest Pain  Disposition Per Guideline:   See Today in Office  Reason For Disposition Reached:   All other patients with chest pain  Advice Given:  N/A  Appointment Scheduled:  05/13/2012 15:30:00 Appointment Scheduled Provider:  Nicki Reaper

## 2012-05-13 NOTE — Progress Notes (Signed)
Subjective:    Patient ID: Samantha Webb, female    DOB: 1955/08/21, 57 y.o.   MRN: [id number]  HPI  Pt presents to the clinic with c/o chest pain and chest tightness. This started 2 weeks ago. She denies shortness of breath or radiating pain. She also c/o of feeling a sharp pain in her back 1 month ago. 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 She is unsure if she may have strained a muscle in her chest or wether she is having a heart attack. She has a big heart history. She has never had pain like this before. She has not taken anything for the pain.  Review of Systems      Past Medical History  Diagnosis Date  . Fibroid   . History of colonoscopy with polypectomy 2008    benign polyp  . Herpesvirus 2   . GERD (gastroesophageal reflux disease)     Current Outpatient Prescriptions  Medication Sig Dispense Refill  . valACYclovir (VALTREX) 500 MG tablet Take 500 mg by mouth 2 (two) times daily as needed.        Allergies  Allergen Reactions  . Codeine     REACTION: Nausea    Family History  Problem Relation Age of Onset  . Hypertension Mother   . Heart disease Mother   . Heart disease Father   . Diabetes Paternal Grandmother     History   Social History  . Marital Status: Single    Spouse Name: N/A    Number of Children: N/A  . Years of Education: N/A   Occupational History  . Not on file.   Social History Main Topics  . Smoking status: Never Smoker   . Smokeless tobacco: Never Used  . Alcohol Use: Yes  . Drug Use: No  . Sexually Active: Yes -- Female partner(s)    Birth Control/ Protection: Other-see comments     Comment: partner vasectomy   Other Topics Concern  . Not on file   Social History Narrative  . No narrative on file     Constitutional: Denies fever, malaise, fatigue, headache or abrupt weight changes.  Respiratory: Denies difficulty breathing, shortness of breath, cough or sputum production.   Cardiovascular:  Pt reports chest tightness. Denies chest pain, palpitations or swelling in the hands or feet.  Musculoskeletal: Pt reports pain he upper back. Denies decrease in range of motion, difficulty with gait, muscle pain or joint pain and swelling.  Neurological: Denies dizziness, difficulty with memory, difficulty with speech or problems with balance and coordination.   No other specific complaints in a complete review of systems (except as listed in HPI above).  Objective:   Physical Exam  BP 122/82  Pulse 77  Temp 97.5 F (36.4 C) (Oral)  Ht 5' 4.5" (1.638 m)  Wt 160 lb 4 oz (72.689 kg)  BMI 27.08 kg/m2  SpO2 97%  LMP 04/04/2010 Wt Readings from Last 3 Encounters:  05/13/12 160 lb 4 oz (72.689 kg)  12/16/11 156 lb (70.761 kg)  08/29/11 163 lb 12 oz (74.277 kg)    General: Appears her stated age, well developed, well nourished in NAD. Cardiovascular: Normal rate and rhythm. S1,S2 noted.  No murmur, rubs or gallops noted. No JVD or BLE edema. No carotid bruits noted. Pulmonary/Chest: Normal effort and positive vesicular breath sounds. No respiratory distress. No wheezes, rales or ronchi noted.  Musculoskeletal: Muscle tightness noted in the upper chest and upper back.  Normal range of motion. No signs of joint swelling. No difficulty with gait.  Neurological: Alert and oriented. Cranial nerves II-XII intact. Coordination normal. +DTRs bilaterally.   EKG: WNL       Assessment & Plan:   Chest tightness, likely due to muscle strain of chest wall:  Will obtain EKG to r/o cardiac ischemia eRx for Diclofenac 75 mg BID eRx for Flexeril 10 mg TID  Perform stretching exercises  RTC as needed or if symptoms persist. If develop chest pain and shortness of breath, go the the nearest ER.

## 2012-05-14 ENCOUNTER — Ambulatory Visit: Payer: BC Managed Care – PPO | Admitting: Internal Medicine

## 2012-09-02 ENCOUNTER — Encounter: Payer: Self-pay | Admitting: Women's Health

## 2012-09-02 ENCOUNTER — Other Ambulatory Visit (HOSPITAL_COMMUNITY)
Admission: RE | Admit: 2012-09-02 | Discharge: 2012-09-02 | Disposition: A | Discharge status: Home / Self Care | Payer: BC Managed Care – PPO | Source: Ambulatory Visit | Attending: Obstetrics and Gynecology | Admitting: Obstetrics and Gynecology

## 2012-09-02 ENCOUNTER — Ambulatory Visit (INDEPENDENT_AMBULATORY_CARE_PROVIDER_SITE_OTHER): Payer: BC Managed Care – PPO | Admitting: Women's Health

## 2012-09-02 VITALS — BP 140/74 | Ht 64.25 in | Wt 157.0 lb

## 2012-09-02 DIAGNOSIS — Z01419 Encounter for gynecological examination (general) (routine) without abnormal findings: Secondary | ICD-10-CM

## 2012-09-02 DIAGNOSIS — B009 Herpesviral infection, unspecified: Secondary | ICD-10-CM

## 2012-09-02 DIAGNOSIS — Z79890 Hormone replacement therapy (postmenopausal): Secondary | ICD-10-CM

## 2012-09-02 DIAGNOSIS — A499 Bacterial infection, unspecified: Secondary | ICD-10-CM

## 2012-09-02 DIAGNOSIS — F411 Generalized anxiety disorder: Secondary | ICD-10-CM

## 2012-09-02 DIAGNOSIS — B9689 Other specified bacterial agents as the cause of diseases classified elsewhere: Secondary | ICD-10-CM

## 2012-09-02 DIAGNOSIS — N760 Acute vaginitis: Secondary | ICD-10-CM

## 2012-09-02 LAB — WET PREP FOR TRICH, YEAST, CLUE
Trich, Wet Prep: NONE SEEN
Yeast Wet Prep HPF POC: NONE SEEN

## 2012-09-02 MED ORDER — VALACYCLOVIR HCL 500 MG PO TABS: 500.0000 mg | ORAL_TABLET | Freq: Two times a day (BID) | ORAL | Status: AC | PRN

## 2012-09-02 MED ORDER — ALPRAZOLAM 0.25 MG PO TABS: 0.2500 mg | ORAL_TABLET | Freq: Every evening | ORAL | Status: DC | PRN

## 2012-09-02 MED ORDER — METRONIDAZOLE 0.75 % VA GEL: VAGINAL | Status: DC

## 2012-09-02 MED ORDER — ESTRADIOL-NORETHINDRONE ACET 0.5-0.1 MG PO TABS: 1.0000 | ORAL_TABLET | Freq: Every day | ORAL | Status: DC

## 2012-09-02 NOTE — Addendum Note (Signed)
Addended by: Richardson Chiquito on: 09/02/2012 12:55 PM   Modules accepted: Orders

## 2012-09-02 NOTE — Progress Notes (Signed)
Samantha Webb 1955/05/10 [id number]    History:    The patient presents for annual exam.  Amenorrheic greater than one year with menopausal symptoms. Normal Pap and mammogram history. 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 Negative polyp with colonoscopy 2008/chronic constipation that has been relieved by taking a magnesium supplement daily. Same partner/years. Tearful and upset best friend of 30 years died recently, mother died in the past year, another friend with ovarian cancer.   Past medical history, past surgical history, family history and social history were all reviewed and documented in the EPIC chart. 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 Originally from [place].   ROS:  A  ROS was performed and pertinent positives and negatives are included in the history.  Exam:  Filed Vitals:   09/02/12 0933  BP: 140/74    General appearance:  Normal Head/Neck:  Normal, without cervical or supraclavicular adenopathy. Thyroid:  Symmetrical, normal in size, without palpable masses or nodularity. Respiratory  Effort:  Normal  Auscultation:  Clear without wheezing or rhonchi Cardiovascular  Auscultation:  Regular rate, without rubs, murmurs or gallops  Edema/varicosities:  Not grossly evident Abdominal  Soft,nontender, without masses, guarding or rebound.  Liver/spleen:  No organomegaly noted  Hernia:  None appreciated  Skin  Inspection:  Grossly normal  Palpation:  Grossly normal Neurologic/psychiatric  Orientation:  Normal with appropriate conversation.  Mood/affect:  Normal  Genitourinary    Breasts: Examined lying and sitting.     Right: Without masses, retractions, discharge or axillary adenopathy.     Left: Without masses, retractions, discharge or axillary adenopathy.   Inguinal/mons:  Normal without inguinal adenopathy  External genitalia:  Normal  BUS/Urethra/Skene's glands:  Normal  Bladder:  Normal  Vagina:  Moderate amount of milky white discharge wet  prep positive for amines, clues, TNTC bacteria  Cervix:  Normal  Uterus:  Fibroid uterus/10 weeks' size,   Midline and mobile  Adnexa/parametria:     Rt: Without masses or tenderness.   Lt: Without masses or tenderness.  Anus and perineum: Normal  Digital rectal exam: Normal sphincter tone without palpated masses or tenderness  Assessment/Plan:  57 y.o. DW F. G1 P1  for annual exam.     Numerous menopausal symptoms Bacteria vaginosis Situational stress-numerous losses HSV rare outbreaks Negative polyp on colonoscopy 2008  Plan: Options reviewed, HRT versus nonhormonal, will try Activella 0.5/0.1, prescription, reviewed risk for blood clots, strokes, breast cancer. Instructed to call if no relief of symptoms. Xanax 0.25 when necessary has used in the past sparingly prescription given aware of addictive properties. SBE's, continue annual mammogram, calcium rich diet, vitamin D 2000 daily. DEXA will schedule here. Reviewed importance of exercise in relationship to bone and general health. Repeat colonoscopy this year, will schedule. Valtrex 500 twice daily 3-5 days as needed. Instructed to call if any further bleeding. MetroGel vaginal cream 1 applicator at bedtime x5, instructed to call if no relief of symptoms. Alcohol precautions reviewed. Pap, Pap normal 2012. Labs at primary care.   Harrington Challenger WHNP, 11:09 AM 09/02/2012

## 2012-09-17 ENCOUNTER — Encounter: Payer: Self-pay | Admitting: Women's Health

## 2012-09-17 ENCOUNTER — Telehealth: Payer: Self-pay | Admitting: *Deleted

## 2012-09-17 NOTE — Telephone Encounter (Signed)
Pt informed with normal pap results.

## 2012-10-25 ENCOUNTER — Ambulatory Visit (INDEPENDENT_AMBULATORY_CARE_PROVIDER_SITE_OTHER): Payer: BC Managed Care – PPO | Admitting: Family Medicine

## 2012-10-25 ENCOUNTER — Encounter: Payer: Self-pay | Admitting: Family Medicine

## 2012-10-25 VITALS — BP 130/80 | Temp 98.2°F | Wt 157.0 lb

## 2012-10-25 DIAGNOSIS — M62830 Muscle spasm of back: Secondary | ICD-10-CM

## 2012-10-25 DIAGNOSIS — M5380 Other specified dorsopathies, site unspecified: Secondary | ICD-10-CM

## 2012-10-25 MED ORDER — CYCLOBENZAPRINE HCL 5 MG PO TABS: 5.0000 mg | ORAL_TABLET | Freq: Two times a day (BID) | ORAL | Status: DC | PRN

## 2012-10-25 NOTE — Patient Instructions (Signed)
-  use flexeril twice daily for next few days as needed for tight muscles  -heat for 15 minutes twice daily  -ibuprofen or tylenol according to instructions as needed for pain, topical sports creams with capsacin or menthol may also be helpful  -please do home exercises at least 4 times per week  -follow up with your doctor in 4 weeks

## 2012-10-25 NOTE — Progress Notes (Signed)
Chief Complaint  Patient presents with  . Back Pain    HPI:  Acute visit for low back: -chronically for 6 months, reports has had workup with PCP and xrays -symptoms: on and off, pain in rhomboid area -better with/worse with: usually worse when doing a lot of physical work at [employer] -sometimes feels like pain radiates to ? Nose??? -denies: weight loss, fevers, malaise, radiation to arms, weakness in arms or legs  ROS: See pertinent positives and negatives per HPI.  Past Medical History  Diagnosis Date  . Fibroid   . History of colonoscopy with polypectomy 2008    benign polyp  . Herpesvirus 2   . GERD (gastroesophageal reflux disease)     Family History  Problem Relation Age of Onset  . Hypertension Mother   . Heart disease Mother   . Heart disease Father   . Diabetes Paternal Grandmother     History   Social History  . Marital Status: Single    Spouse Name: N/A    Number of Children: N/A  . Years of Education: N/A   Social History Main Topics  . Smoking status: Former Games developer  . Smokeless tobacco: Never Used  . Alcohol Use: Yes     Comment: sometimes  . Drug Use: No  . Sexually Active: Yes -- Female partner(s)    Birth Control/ Protection: Other-see comments     Comment: partner vasectomy   Other Topics Concern  . None   Social History Narrative  . None    Current outpatient prescriptions:valACYclovir (VALTREX) 500 MG tablet, Take 1 tablet (500 mg total) by mouth 2 (two) times daily as needed., Disp: 30 tablet, Rfl: 12;  ALPRAZolam (XANAX) 0.25 MG tablet, Take 1 tablet (0.25 mg total) by mouth at bedtime as needed for sleep., Disp: 30 tablet, Rfl: 1;  cyclobenzaprine (FLEXERIL) 5 MG tablet, Take 1 tablet (5 mg total) by mouth 2 (two) times daily as needed for muscle spasms., Disp: 15 tablet, Rfl: 0 Estradiol-Norethindrone Acet 0.5-0.1 MG per tablet, Take 1 tablet by mouth daily., Disp: 30 tablet, Rfl: 11;  metroNIDAZOLE (METROGEL VAGINAL) 0.75 % vaginal gel,  1 applicator per vagina at HS x 5, Disp: 70 g, Rfl: 0  EXAM:  Filed Vitals:   10/25/12 1548  BP: 130/80  Temp: 98.2 F (36.8 C)    Body mass index is 26.74 kg/(m^2).  GENERAL: vitals reviewed and listed above, alert, oriented, appears well hydrated and in no acute distress  HEENT: atraumatic, conjunttiva clear, no obvious abnormalities on inspection of external nose and ears  NECK: no obvious masses on inspection  LUNGS: clear to auscultation bilaterally, no wheezes, rales or rhonchi, good air movement  CV: HRRR, no peripheral edema  MS: moves all extremities without noticeable abnormality -TTP and pain reproduced with palpation R rhomboids, thoracic paraspinal muscles, no bony TTP, no winging of scapula, normal ROM and strength in upper extremities  PSYCH: pleasant and cooperative, no obvious depression or anxiety  ASSESSMENT AND PLAN:  Discussed the following assessment and plan:  Muscle spasm of back - Plan: cyclobenzaprine (FLEXERIL) 5 MG tablet  -feel this is muscle spasm related to posture at work  -advised proper posture, counterbalancing exercises -back rehab exercises provided -muscle relaxer (risks discussed) to use acutely -offered ultram, but she is ok with using tylenol and ibuprofen -follow up with PCP in 4 weeks -Patient advised to return or notify a doctor immediately if symptoms worsen or persist or new concerns arise.  Patient Instructions  -  use flexeril twice daily for next few days as needed for tight muscles  -heat for 15 minutes twice daily  -ibuprofen or tylenol according to instructions as needed for pain, topical sports creams with capsacin or menthol may also be helpful  -please do home exercises at least 4 times per week  -follow up with your doctor in 4 weeks     Diquan Kassis, [facility].

## 2012-10-29 ENCOUNTER — Telehealth: Payer: Self-pay | Admitting: Internal Medicine

## 2012-10-29 NOTE — Telephone Encounter (Signed)
PT's boyfriend called and stated that the PT is having some problems that they both feel like she should be seen by Dr. Tawanna Cooler for. The patient would like to switch pcp from Dr. Felicity Coyer to Dr. Tawanna Cooler. May I make this transfer?

## 2012-10-29 NOTE — Telephone Encounter (Signed)
Ok with me. thanks 

## 2012-10-30 NOTE — Telephone Encounter (Signed)
I will defer this change to Dr Tawanna Cooler and his BF stff

## 2012-11-01 NOTE — Telephone Encounter (Signed)
I am not able to take any new patients at this point

## 2012-11-01 NOTE — Telephone Encounter (Signed)
Called and s/w pt's boyfriend, and he stated that he would look into other providers, and call me back.

## 2012-11-12 ENCOUNTER — Ambulatory Visit (INDEPENDENT_AMBULATORY_CARE_PROVIDER_SITE_OTHER)
Admission: RE | Admit: 2012-11-12 | Discharge: 2012-11-12 | Disposition: A | Discharge status: Home / Self Care | Payer: BC Managed Care – PPO | Source: Ambulatory Visit | Attending: Internal Medicine | Admitting: Internal Medicine

## 2012-11-12 ENCOUNTER — Ambulatory Visit (INDEPENDENT_AMBULATORY_CARE_PROVIDER_SITE_OTHER): Payer: BC Managed Care – PPO | Admitting: Internal Medicine

## 2012-11-12 ENCOUNTER — Encounter: Payer: Self-pay | Admitting: Internal Medicine

## 2012-11-12 VITALS — BP 130/86 | HR 87 | Temp 97.9°F | Resp 18 | Ht 64.5 in | Wt 156.6 lb

## 2012-11-12 DIAGNOSIS — M5010 Cervical disc disorder with radiculopathy, unspecified cervical region: Secondary | ICD-10-CM

## 2012-11-12 DIAGNOSIS — M5412 Radiculopathy, cervical region: Secondary | ICD-10-CM

## 2012-11-12 DIAGNOSIS — M898X1 Other specified disorders of bone, shoulder: Secondary | ICD-10-CM

## 2012-11-12 DIAGNOSIS — M899 Disorder of bone, unspecified: Secondary | ICD-10-CM

## 2012-11-12 MED ORDER — PREDNISONE (PAK) 10 MG PO TABS: 10.0000 mg | ORAL_TABLET | ORAL | Status: AC

## 2012-11-12 MED ORDER — TIZANIDINE HCL 4 MG PO TABS: 4.0000 mg | ORAL_TABLET | Freq: Four times a day (QID) | ORAL | Status: DC | PRN

## 2012-11-12 NOTE — Patient Instructions (Signed)
It was good to see you today. We have reviewed your prior records including labs and tests today Test(s) ordered today - xray in office today, MRI to be scheduled. Your results will be released to MyChart (or called to you) after review, usually within 72hours after test completion. If any changes need to be made, you will be notified at that same time. For pain until testing complete, take prednisone taper for next 6 days and use Zanaflex as a muscle relaxer Your prescription(s) have been submitted to your pharmacy. Please take as directed and contact our office if you believe you are having problem(s) with the medication(s).  Cervical Radiculopathy Cervical radiculopathy happens when a nerve in the neck is pinched or bruised by a slipped (herniated) disk or by arthritic changes in the bones of the cervical spine. This can occur due to an injury or as part of the normal aging process. Pressure on the cervical nerves can cause pain or numbness that runs from your neck all the way down into your arm and fingers. CAUSES  There are many possible causes, including:  Injury.  Muscle tightness in the neck from overuse.  Swollen, painful joints (arthritis).  Breakdown or degeneration in the bones and joints of the spine (spondylosis) due to aging.  Bone spurs that may develop near the cervical nerves. SYMPTOMS  Symptoms include pain, weakness, or numbness in the affected arm and hand. Pain can be severe or irritating. Symptoms may be worse when extending or turning the neck. DIAGNOSIS  Your caregiver will ask about your symptoms and do a physical exam. He or she may test your strength and reflexes. X-rays, CT scans, and MRI scans may be needed in cases of injury or if the symptoms do not go away after a period of time. Electromyography (EMG) or nerve conduction testing may be done to study how your nerves and muscles are working. TREATMENT  Your caregiver may recommend certain exercises to help  relieve your symptoms. Cervical radiculopathy can, and often does, get better with time and treatment. If your problems continue, treatment options may include:  Wearing a soft collar for short periods of time.  Physical therapy to strengthen the neck muscles.  Medicines, such as nonsteroidal anti-inflammatory drugs (NSAIDs), oral corticosteroids, or spinal injections.  Surgery. Different types of surgery may be done depending on the cause of your problems. HOME CARE INSTRUCTIONS   Put ice on the affected area.  Put ice in a plastic bag.  Place a towel between your skin and the bag.  Leave the ice on for 15-20 minutes, 3-4 times a day or as directed by your caregiver.  If ice does not help, you can try using heat. Take a warm shower or bath, or use a hot water bottle as directed by your caregiver.  You may try a gentle neck and shoulder massage.  Use a flat pillow when you sleep.  Only take over-the-counter or prescription medicines for pain, discomfort, or fever as directed by your caregiver.  If physical therapy was prescribed, follow your caregiver's directions.  If a soft collar was prescribed, use it as directed. SEEK IMMEDIATE MEDICAL CARE IF:   Your pain gets much worse and cannot be controlled with medicines.  You have weakness or numbness in your hand, arm, face, or leg.  You have a high fever or a stiff, rigid neck.  You lose bowel or bladder control (incontinence).  You have trouble with walking, balance, or speaking. MAKE SURE YOU:  Understand these instructions.  Will watch your condition.  Will get help right away if you are not doing well or get worse. Document Released: 01/14/2001 Document Revised: 07/14/2011 Document Reviewed: 12/03/2010 [facility] Patient Information 2014 [place], [place].

## 2012-11-12 NOTE — Progress Notes (Signed)
  Subjective:    Patient ID: Samantha Webb, female    DOB: 1956-03-20, 57 y.o.   MRN: [id number]  HPI  complains of "scapula" inside R shoulder blade/upper back Ongoing pain >63mo without change Pain is constant, 4-7/10 Pain radiates into RUE, R anterior chest, neck and face Does not cross midline Not exacerbated by activity, position Not improve with muscle relaxer or NSAIDs No numbness, no falls  Past Medical History  Diagnosis Date  . Fibroid   . History of colonoscopy with polypectomy 2008    benign polyp  . Herpesvirus 2   . GERD (gastroesophageal reflux disease)     Review of Systems  Constitutional: Negative for fever and fatigue.  Respiratory: Negative for cough and shortness of breath.   Cardiovascular: Negative for chest pain and leg swelling.  Neurological: Negative for tremors, seizures, facial asymmetry, weakness, numbness and headaches.       Objective:   Physical Exam BP 130/86  Pulse 87  Temp(Src) 97.9 F (36.6 C) (Oral)  Resp 18  Ht 5' 4.5" (1.638 m)  Wt 156 lb 9.6 oz (71.033 kg)  BMI 26.47 kg/m2  SpO2 97%  LMP 04/04/2010 Wt Readings from Last 3 Encounters:  11/12/12 156 lb 9.6 oz (71.033 kg)  10/25/12 157 lb (71.215 kg)  09/02/12 157 lb (71.215 kg)   Constitutional: She appears well-developed and well-nourished. No distress.  Neck: Normal range of motion. Neck supple. No JVD present. No thyromegaly present.  Cardiovascular: Normal rate, regular rhythm and normal heart sounds.  No murmur heard. No BLE edema. Pulmonary/Chest: Effort normal and breath sounds normal. No respiratory distress. She has no wheezes.  Musculoskeletal:  Neck - spasm R trap - R shoulder FROM, good strength - Equal grip B hands, neg Romberg. Back: full range of motion of thoracic and lumbar spine. Non tender to palpation. Negative straight leg raise. DTR's are symmetrically intact. Sensation intact in all dermatomes of the lower extremities. Full strength to manual muscle  testing. patient is able to heel toe walk without difficulty and ambulates with antalgic gait. Neurological: She is alert and oriented to person, place, and time. No cranial nerve deficit. Coordination, balance, strength, speech and gait are normal.  Skin: Skin is warm and dry. No rash noted. No erythema.  Psychiatric: She has a normal mood and affect. Her behavior is normal. Judgment and thought content normal.   Lab Results  Component Value Date   WBC 6.2 08/29/2011   HGB 12.6 08/29/2011   HCT 37.3 08/29/2011   PLT 163.0 08/29/2011   GLUCOSE 95 08/29/2011   ALT 17 08/29/2011   AST 23 08/29/2011   NA 139 08/29/2011   K 3.5 08/29/2011   CL 103 08/29/2011   CREATININE 0.7 08/29/2011   BUN 14 08/29/2011   CO2 28 08/29/2011         Assessment & Plan:   Pain R neck, scapula and RUE for >3 mo consistent with cervical radiculopathy  Unresponsive and unimproved with conservative care and muscle relaxant Check DG for cervcal DDD/spur now Arrange for cervical MRI Treat with prednisone taper x6 days and change muscle relaxant Education and reassurance provided, patient to call if symptoms worse or unimproved  Plan referral to specialist as needed based on MRI results

## 2012-11-17 ENCOUNTER — Encounter: Payer: Self-pay | Admitting: Internal Medicine

## 2013-01-26 ENCOUNTER — Encounter: Payer: BC Managed Care – PPO | Admitting: Internal Medicine

## 2013-03-10 ENCOUNTER — Other Ambulatory Visit: Payer: Self-pay

## 2013-07-13 ENCOUNTER — Other Ambulatory Visit: Payer: Self-pay

## 2013-07-13 DIAGNOSIS — Z1231 Encounter for screening mammogram for malignant neoplasm of breast: Secondary | ICD-10-CM

## 2013-07-27 ENCOUNTER — Other Ambulatory Visit: Payer: Self-pay

## 2013-07-27 ENCOUNTER — Ambulatory Visit: Payer: BC Managed Care – PPO

## 2013-07-27 ENCOUNTER — Ambulatory Visit
Admission: RE | Admit: 2013-07-27 | Discharge: 2013-07-27 | Disposition: A | Discharge status: Home / Self Care | Payer: Self-pay | Source: Ambulatory Visit | Attending: Women's Health | Admitting: Women's Health

## 2013-07-27 ENCOUNTER — Ambulatory Visit
Admission: RE | Admit: 2013-07-27 | Discharge: 2013-07-27 | Disposition: A | Discharge status: Home / Self Care | Payer: Self-pay | Source: Ambulatory Visit

## 2013-07-27 DIAGNOSIS — Z79890 Hormone replacement therapy (postmenopausal): Secondary | ICD-10-CM

## 2013-07-27 DIAGNOSIS — Z1231 Encounter for screening mammogram for malignant neoplasm of breast: Secondary | ICD-10-CM

## 2013-07-31 ENCOUNTER — Encounter: Payer: Self-pay | Admitting: Women's Health

## 2013-08-15 ENCOUNTER — Other Ambulatory Visit: Payer: Self-pay | Admitting: Dermatology

## 2013-12-01 ENCOUNTER — Encounter: Payer: Self-pay | Admitting: Gynecology

## 2013-12-01 ENCOUNTER — Ambulatory Visit (INDEPENDENT_AMBULATORY_CARE_PROVIDER_SITE_OTHER): Payer: BC Managed Care – PPO | Admitting: Gynecology

## 2013-12-01 VITALS — BP 124/78

## 2013-12-01 DIAGNOSIS — N952 Postmenopausal atrophic vaginitis: Secondary | ICD-10-CM

## 2013-12-01 DIAGNOSIS — IMO0002 Reserved for concepts with insufficient information to code with codable children: Secondary | ICD-10-CM

## 2013-12-01 DIAGNOSIS — Z113 Encounter for screening for infections with a predominantly sexual mode of transmission: Secondary | ICD-10-CM

## 2013-12-01 DIAGNOSIS — N898 Other specified noninflammatory disorders of vagina: Secondary | ICD-10-CM

## 2013-12-01 LAB — WET PREP FOR TRICH, YEAST, CLUE
CLUE CELLS WET PREP: NONE SEEN
Trich, Wet Prep: NONE SEEN

## 2013-12-01 MED ORDER — TINIDAZOLE 500 MG PO TABS: ORAL_TABLET | ORAL | Status: DC

## 2013-12-01 MED ORDER — FLUCONAZOLE 150 MG PO TABS: 150.0000 mg | ORAL_TABLET | Freq: Once | ORAL | Status: DC

## 2013-12-01 NOTE — Patient Instructions (Signed)
Monilial Vaginitis Vaginitis in a soreness, swelling and redness (inflammation) of the vagina and vulva. Monilial vaginitis is not a sexually transmitted infection. CAUSES  Yeast vaginitis is caused by yeast (candida) that is normally found in your vagina. With a yeast infection, the candida has overgrown in number to a point that upsets the chemical balance. SYMPTOMS   White, thick vaginal discharge.  Swelling, itching, redness and irritation of the vagina and possibly the lips of the vagina (vulva).  Burning or painful urination.  Painful intercourse. DIAGNOSIS  Things that may contribute to monilial vaginitis are:  Postmenopausal and virginal states.  Pregnancy.  Infections.  Being tired, sick or stressed, especially if you had monilial vaginitis in the past.  Diabetes. Good control will help lower the chance.  Birth control pills.  Tight fitting garments.  Using bubble bath, feminine sprays, douches or deodorant tampons.  Taking certain medications that kill germs (antibiotics).  Sporadic recurrence can occur if you become ill. TREATMENT  Your caregiver will give you medication.  There are several kinds of anti monilial vaginal creams and suppositories specific for monilial vaginitis. For recurrent yeast infections, use a suppository or cream in the vagina 2 times a week, or as directed.  Anti-monilial or steroid cream for the itching or irritation of the vulva may also be used. Get your caregiver's permission.  Painting the vagina with methylene blue solution may help if the monilial cream does not work.  Eating yogurt may help prevent monilial vaginitis. HOME CARE INSTRUCTIONS   Finish all medication as prescribed.  Do not have sex until treatment is completed or after your caregiver tells you it is okay.  Take warm sitz baths.  Do not douche.  Do not use tampons, especially scented ones.  Wear cotton underwear.  Avoid tight pants and panty  hose.  Tell your sexual partner that you have a yeast infection. They should go to their caregiver if they have symptoms such as mild rash or itching.  Your sexual partner should be treated as well if your infection is difficult to eliminate.  Practice safer sex. Use condoms.  Some vaginal medications cause latex condoms to fail. Vaginal medications that harm condoms are:  Cleocin cream.  Butoconazole (Femstat).  Terconazole (Terazol) vaginal suppository.  Miconazole (Monistat) (may be purchased over the counter). SEEK MEDICAL CARE IF:   You have a temperature by mouth above 102 F (38.9 C).  The infection is getting worse after 2 days of treatment.  The infection is not getting better after 3 days of treatment.  You develop blisters in or around your vagina.  You develop vaginal bleeding, and it is not your menstrual period.  You have pain when you urinate.  You develop intestinal problems.  You have pain with sexual intercourse. Document Released: 01/29/2005 Document Revised: 07/14/2011 Document Reviewed: 10/13/2008 ExitCare Patient Information 2015 [employer]. This information is not intended to replace advice given to you by your health care provider. Make sure you discuss any questions you have with your health care provider. Bacterial Vaginosis Bacterial vaginosis is a vaginal infection that occurs when the normal balance of bacteria in the vagina is disrupted. It results from an overgrowth of certain bacteria. This is the most common vaginal infection in women of childbearing age. Treatment is important to prevent complications, especially in pregnant women, as it can cause a premature delivery. CAUSES  Bacterial vaginosis is caused by an increase in harmful bacteria that are normally present in smaller amounts in the   vagina. Several different kinds of bacteria can cause bacterial vaginosis. However, the reason that the condition develops is not fully  understood. RISK FACTORS Certain activities or behaviors can put you at an increased risk of developing bacterial vaginosis, including:  Having a new sex partner or multiple sex partners.  Douching.  Using an intrauterine device (IUD) for contraception. Women do not get bacterial vaginosis from toilet seats, bedding, swimming pools, or contact with objects around them. SIGNS AND SYMPTOMS  Some women with bacterial vaginosis have no signs or symptoms. Common symptoms include:  Grey vaginal discharge.  A fishlike odor with discharge, especially after sexual intercourse.  Itching or burning of the vagina and vulva.  Burning or pain with urination. DIAGNOSIS  Your health care provider will take a medical history and examine the vagina for signs of bacterial vaginosis. A sample of vaginal fluid may be taken. Your health care provider will look at this sample under a microscope to check for bacteria and abnormal cells. A vaginal pH test may also be done.  TREATMENT  Bacterial vaginosis may be treated with antibiotic medicines. These may be given in the form of a pill or a vaginal cream. A second round of antibiotics may be prescribed if the condition comes back after treatment.  HOME CARE INSTRUCTIONS   Only take over-the-counter or prescription medicines as directed by your health care provider.  If antibiotic medicine was prescribed, take it as directed. Make sure you finish it even if you start to feel better.  Do not have sex until treatment is completed.  Tell all sexual partners that you have a vaginal infection. They should see their health care provider and be treated if they have problems, such as a mild rash or itching.  Practice safe sex by using condoms and only having one sex partner. SEEK MEDICAL CARE IF:   Your symptoms are not improving after 3 days of treatment.  You have increased discharge or pain.  You have a fever. MAKE SURE YOU:   Understand these  instructions.  Will watch your condition.  Will get help right away if you are not doing well or get worse. FOR MORE INFORMATION  Centers for Disease Control and Prevention, Division of STD Prevention: AppraiserFraud.fi American Sexual Health Association (ASHA): www.ashastd.org  Document Released: 04/21/2005 Document Revised: 02/09/2013 Document Reviewed: 12/01/2012 [facility] Patient Information 2015 [place], [place]. This information is not intended to replace advice given to you by your health care provider. Make sure you discuss any questions you have with your health care provider. Estradiol vaginal tablets What is this medicine? ESTRADIOL (es tra DYE ole) vaginal tablet is used to help relieve symptoms of vaginal irritation and dryness that occurs in some women during menopause. This medicine may be used for other purposes; ask your health care provider or pharmacist if you have questions. COMMON BRAND NAME(S): Vagifem What should I tell my health care provider before I take this medicine? They need to know if you have any of these conditions: -abnormal vaginal bleeding -blood vessel disease or blood clots -breast, cervical, endometrial, ovarian, liver, or uterine cancer -dementia -diabetes -gallbladder disease -heart disease or recent heart attack -high blood pressure -high cholesterol -high level of calcium in the blood -hysterectomy -kidney disease -liver disease -migraine headaches -protein C deficiency -protein S deficiency -stroke -systemic lupus erythematosus (SLE) -tobacco smoker -an unusual or allergic reaction to estrogens, other hormones, medicines, foods, dyes, or preservatives -pregnant or trying to get pregnant -breast-feeding How should I use  this medicine? This medicine is only for use in the vagina. Do not take by mouth. Wash your hands before and after use. Read package directions carefully. Unwrap the pre-filled applicator package. Lie on your back, part  and bend your knees. Gently insert the applicator tip high in the vagina and push the plunger to release the tablet into the vagina. Gently remove the applicator. Throw away the applicator after use. Do not use your medicine more often than directed. Finish the full course prescribed by your doctor or health care professional even if you think your condition is better. Do not stop using except on the advice of your doctor or health care professional. Talk to your pediatrician regarding the use of this medicine in children. A patient package insert for the product will be given with each prescription and refill. Read this sheet carefully each time. The sheet may change frequently. Overdosage: If you think you have taken too much of this medicine contact a poison control center or emergency room at once. NOTE: This medicine is only for you. Do not share this medicine with others. What if I miss a dose? If you miss a dose, take it as soon as you can. If it is almost time for your next dose, take only that dose. Do not take double or extra doses. What may interact with this medicine? Do not take this medicine with any of the following medications: -aromatase inhibitors like aminoglutethimide, anastrozole, exemestane, letrozole, testolactone This medicine may also interact with the following medications: -antibiotics used to treat tuberculosis like rifabutin, rifampin and rifapentene -raloxifene or tamoxifen -warfarin This list may not describe all possible interactions. Give your health care provider a list of all the medicines, herbs, non-prescription drugs, or dietary supplements you use. Also tell them if you smoke, drink alcohol, or use illegal drugs. Some items may interact with your medicine. What should I watch for while using this medicine? Visit your health care professional for regular checks on your progress. You will need a regular breast and pelvic exam. You should also discuss the need for  regular mammograms with your health care professional, and follow his or her guidelines. This medicine can make your body retain fluid, making your fingers, hands, or ankles swell. Your blood pressure can go up. Contact your doctor or health care professional if you feel you are retaining fluid. If you have any reason to think you are pregnant; stop taking this medicine at once and contact your doctor or health care professional. Tobacco smoking increases the risk of getting a blood clot or having a stroke, especially if you are more than 57 years old. You are strongly advised not to smoke. If you wear contact lenses and notice visual changes, or if the lenses begin to feel uncomfortable, consult your eye care specialist. If you are going to have elective surgery, you may need to stop taking this medicine beforehand. Consult your health care professional for advice prior to scheduling the surgery. What side effects may I notice from receiving this medicine? Side effects that you should report to your doctor or health care professional as soon as possible: -allergic reactions like skin rash, itching or hives, swelling of the face, lips, or tongue -breast tissue changes or discharge -changes in vision -chest pain -confusion, trouble speaking or understanding -dark urine -general ill feeling or flu-like symptoms -light-colored stools -nausea, vomiting -pain, swelling, warmth in the leg -right upper belly pain -severe headaches -shortness of breath -sudden numbness or weakness  of the face, arm or leg -trouble walking, dizziness, loss of balance or coordination -unusual vaginal bleeding -yellowing of the eyes or skin Side effects that usually do not require medical attention (report to your doctor or health care professional if they continue or are bothersome): -hair loss -increased hunger or thirst -increased urination -symptoms of vaginal infection like itching, irritation or unusual  discharge -unusually weak or tired This list may not describe all possible side effects. Call your doctor for medical advice about side effects. You may report side effects to FDA at 1-800-FDA-1088. Where should I keep my medicine? Keep out of the reach of children. Store at room temperature between 15 and 30 degrees C (59 and 86 degrees F). Throw away any unused medicine after the expiration date. NOTE: This sheet is a summary. It may not cover all possible information. If you have questions about this medicine, talk to your doctor, pharmacist, or health care provider.  2015, Elsevier/Gold Standard. (2010-07-24 09:08:58)

## 2013-12-01 NOTE — Addendum Note (Signed)
Addended by: Thurnell Garbe A on: 12/01/2013 04:00 PM   Modules accepted: Orders

## 2013-12-01 NOTE — Progress Notes (Signed)
   58 year old postmenopausal patient on no hormone replacement therapy complaining of a greenish vaginal discharge and some pruritus the past 2 days. She has been with the same partner for about 8 years. She is complaining of vaginal dryness and dyspareunia and she uses coconut oil for lubricant. She denies any dysuria or frequency.  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 She will be prescribed Tindamax 500 mg 4 tablets daily for 2 days for her early BV. I have given her literature information on Vagifem and [place]. A GC and chlamydia culture pending at time of this dictation.

## 2013-12-02 LAB — GC/CHLAMYDIA PROBE AMP
CT PROBE, AMP APTIMA: NEGATIVE
GC Probe RNA: NEGATIVE

## 2013-12-05 ENCOUNTER — Ambulatory Visit: Payer: BC Managed Care – PPO | Admitting: Gynecology

## 2013-12-28 ENCOUNTER — Encounter: Payer: BC Managed Care – PPO | Admitting: Women's Health

## 2014-02-04 ENCOUNTER — Telehealth: Payer: Self-pay | Admitting: Gynecology

## 2014-02-04 MED ORDER — METRONIDAZOLE 500 MG PO TABS: 500.0000 mg | ORAL_TABLET | Freq: Two times a day (BID) | ORAL | Status: DC

## 2014-02-04 NOTE — Telephone Encounter (Signed)
On Call Note:  Started with yellow discharge yesterday, slight bloody tinge this AM.  Some right sided pain but has been doing a lot of lifting and not sure if due to that.  Postmenopausal times several years.  Eating, voiding, BM today.  No fevers chills N/V UTI symptoms.  Recently treated for yeast/BV end of July.  Recommend Flagyl 500 mg BID X 7 days, alcohol avoidance stressed.  Motrin for pain.  Call office Monday regardless if symptoms clear to arrange OV to be evaluated given the history of bleeding to rule out other causes.  ER/UrgentCare visit over the weekend if the symptoms worsen or change.

## 2014-02-06 ENCOUNTER — Ambulatory Visit (INDEPENDENT_AMBULATORY_CARE_PROVIDER_SITE_OTHER): Payer: BC Managed Care – PPO | Admitting: Gynecology

## 2014-02-06 ENCOUNTER — Encounter: Payer: Self-pay | Admitting: Gynecology

## 2014-02-06 ENCOUNTER — Other Ambulatory Visit: Payer: Self-pay | Admitting: Gynecology

## 2014-02-06 ENCOUNTER — Ambulatory Visit (INDEPENDENT_AMBULATORY_CARE_PROVIDER_SITE_OTHER): Payer: BC Managed Care – PPO

## 2014-02-06 VITALS — BP 118/76

## 2014-02-06 DIAGNOSIS — N898 Other specified noninflammatory disorders of vagina: Secondary | ICD-10-CM

## 2014-02-06 DIAGNOSIS — N8331 Acquired atrophy of ovary: Secondary | ICD-10-CM

## 2014-02-06 DIAGNOSIS — N83319 Acquired atrophy of ovary, unspecified side: Secondary | ICD-10-CM

## 2014-02-06 DIAGNOSIS — D252 Subserosal leiomyoma of uterus: Secondary | ICD-10-CM

## 2014-02-06 DIAGNOSIS — D251 Intramural leiomyoma of uterus: Secondary | ICD-10-CM

## 2014-02-06 DIAGNOSIS — R102 Pelvic and perineal pain: Secondary | ICD-10-CM

## 2014-02-06 DIAGNOSIS — N9489 Other specified conditions associated with female genital organs and menstrual cycle: Secondary | ICD-10-CM

## 2014-02-06 DIAGNOSIS — R1031 Right lower quadrant pain: Secondary | ICD-10-CM

## 2014-02-06 DIAGNOSIS — N950 Postmenopausal bleeding: Secondary | ICD-10-CM

## 2014-02-06 DIAGNOSIS — N949 Unspecified condition associated with female genital organs and menstrual cycle: Secondary | ICD-10-CM

## 2014-02-06 DIAGNOSIS — Z113 Encounter for screening for infections with a predominantly sexual mode of transmission: Secondary | ICD-10-CM

## 2014-02-06 LAB — WET PREP FOR TRICH, YEAST, CLUE
Clue Cells Wet Prep HPF POC: NONE SEEN
Trich, Wet Prep: NONE SEEN
Yeast Wet Prep HPF POC: NONE SEEN

## 2014-02-06 LAB — URINALYSIS W MICROSCOPIC + REFLEX CULTURE
Bilirubin Urine: NEGATIVE
CASTS: NONE SEEN
CRYSTALS: NONE SEEN
GLUCOSE, UA: NEGATIVE mg/dL
Ketones, ur: NEGATIVE mg/dL
Nitrite: NEGATIVE
PH: 6.5 (ref 5.0–8.0)
Protein, ur: NEGATIVE mg/dL
Specific Gravity, Urine: 1.01 (ref 1.005–1.030)
Urobilinogen, UA: 0.2 mg/dL (ref 0.0–1.0)

## 2014-02-06 NOTE — Progress Notes (Signed)
   Patient presented to the office today stating that for the past week she was having some mild back discomfort not radiate to her right lower abdomen. Last week she stated that she had a green-like discharge which was slightly blood-tinged. She has not been sexually active in over week. She has a steady monogamous relationship. She has never been on any hormone replacement therapy and she reached menopause approximately a year and a half ago. Patient denied any GU or GI complaints. Patient denied any nausea, fever, chills, or vomiting. No past history of kidney stones. She did not notice any blood in her stools. Her colonoscopy is up to date and was normal. She called the office during the week and then my partner had started her on Flagyl 500 mg one by mouth twice a day and the event of bacterial vaginosis.  Review of her record had indicated that her primary physician Dr. Cathlean Cower back in 2013 because of right upper abdominal discomfort had an ultrasound ordered here which there was an incidental finding of a small echogenic lesion in the left lobe of the liver. 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 Dr. Jenny Reichmann had refer her to the gastroenterologist and patient did not follow through.  Exam today: Abdomen: Soft nontender no rebound or guarding Back: No CVA tenderness Pelvic: Bartholin urethra Skene glands within normal limits Vagina creamy yellowish discharge was noted. A GC and chlamydia culture along with a wet prep was obtained. Bimanual exam uterus slightly anteverted no large discernible masses on bimanual exam or any pain elicited. Rectal exam not done  Urinalysis: 3-6 WBC, 0-2 RBC, few bacteria  Wet prep:  Many WBC, too numerous to count bacteria  Ultrasound today: Uterus measuring 9.6 x 8.0 x 6.4 cm with endometrial stripe of 3.9 mm. Patient with 6 small fibroids the largest one measuring 4.5 x 4.3 cm slightly displacing the endometrium. There mostly intramural and subserosal. Left ovary not seen left adnexal solid mass measuring 3.2 x 2.6 cm questionable bowel versus adnexal mass there was no color-flow Doppler. Right ovary was normal.  Assessment/plan: #1 patient with history of liver lesion did not followup with the gastroenterologist will be referred to her GI colleagues. Her PCP he recently did battery of blood test she will obtain those lab results and take it with her for her GI appointment. #2 patient will finish her Flagyl 500 mg twice a day for 7 days for her suspected BV #3 facial returned back to the office next week to repeat her ultrasound after she claims her GI tract. She will drink one bottle of magnesium citrate the day before the procedure. She will increase her fluid intake and high fiber intake as well. Will make determination after ultrasound. #4 GC and chlamydia culture pending at time of this dictation #5 results of urine culture pending at time of this dictation

## 2014-02-08 ENCOUNTER — Other Ambulatory Visit: Payer: Self-pay | Admitting: Gynecology

## 2014-02-08 ENCOUNTER — Encounter: Payer: Self-pay | Admitting: Internal Medicine

## 2014-02-08 ENCOUNTER — Telehealth: Payer: Self-pay | Admitting: *Deleted

## 2014-02-08 DIAGNOSIS — R102 Pelvic and perineal pain: Secondary | ICD-10-CM

## 2014-02-08 LAB — GC/CHLAMYDIA PROBE AMP
CT Probe RNA: NEGATIVE
GC Probe RNA: NEGATIVE

## 2014-02-08 LAB — URINE CULTURE: Colony Count: 30000

## 2014-02-08 MED ORDER — NITROFURANTOIN MONOHYD MACRO 100 MG PO CAPS: 100.0000 mg | ORAL_CAPSULE | Freq: Two times a day (BID) | ORAL | Status: DC

## 2014-02-08 NOTE — Telephone Encounter (Signed)
Pt was told to call Dr.Gessner office and make appointment scheduled on 03/13/14. Pt said Dr.Gessner office asked for referral to be placed in epic

## 2014-02-08 NOTE — Telephone Encounter (Signed)
Left on voicemail this is the earliest appointment with Dr.Gessner she will be placed on cancellation list

## 2014-02-17 ENCOUNTER — Ambulatory Visit (INDEPENDENT_AMBULATORY_CARE_PROVIDER_SITE_OTHER): Payer: BC Managed Care – PPO | Admitting: Gynecology

## 2014-02-17 ENCOUNTER — Ambulatory Visit (INDEPENDENT_AMBULATORY_CARE_PROVIDER_SITE_OTHER): Payer: BC Managed Care – PPO

## 2014-02-17 ENCOUNTER — Other Ambulatory Visit: Payer: Self-pay | Admitting: Gynecology

## 2014-02-17 ENCOUNTER — Other Ambulatory Visit: Payer: BC Managed Care – PPO

## 2014-02-17 VITALS — BP 120/74

## 2014-02-17 DIAGNOSIS — D251 Intramural leiomyoma of uterus: Secondary | ICD-10-CM

## 2014-02-17 DIAGNOSIS — D259 Leiomyoma of uterus, unspecified: Secondary | ICD-10-CM

## 2014-02-17 DIAGNOSIS — N949 Unspecified condition associated with female genital organs and menstrual cycle: Secondary | ICD-10-CM

## 2014-02-17 DIAGNOSIS — N9489 Other specified conditions associated with female genital organs and menstrual cycle: Secondary | ICD-10-CM

## 2014-02-17 DIAGNOSIS — D250 Submucous leiomyoma of uterus: Secondary | ICD-10-CM

## 2014-02-17 DIAGNOSIS — Z780 Asymptomatic menopausal state: Secondary | ICD-10-CM

## 2014-02-17 DIAGNOSIS — N840 Polyp of corpus uteri: Secondary | ICD-10-CM

## 2014-02-17 DIAGNOSIS — R9389 Abnormal findings on diagnostic imaging of other specified body structures: Secondary | ICD-10-CM

## 2014-02-17 DIAGNOSIS — R934 Abnormal findings on diagnostic imaging of urinary organs: Secondary | ICD-10-CM

## 2014-02-17 NOTE — Progress Notes (Signed)
   Patient presented to the office today to discuss her ultrasound. She was seen in the office on October the and had been in for a suspected bacterial vaginal infection and was having low abdominal discomfort ultrasound done. The ultrasound demonstrated the following:  Ultrasound today:  Uterus measuring 9.6 x 8.0 x 6.4 cm with endometrial stripe of 3.9 mm. Patient with 6 small fibroids the largest one measuring 4.5 x 4.3 cm slightly displacing the endometrium. There mostly intramural and subserosal. Left ovary not seen left adnexal solid mass measuring 3.2 x 2.6 cm questionable bowel versus adnexal mass there was no color-flow Doppler. Right ovary was normal.  She was asked to return to repeat the ultrasound after she was instructed to take one bottle of magnesium citrate the day before the procedure for better visualization. Patient has never been on any hormone replacement therapy. Today's ultrasound demonstrated the following:  Uterus measured 10.1 x 8.5 x 6.3 cm with endometrial 6 for no free fluid in the cul-de-sac. Both ovaries appeared to be normal. Uterus as described above. Because of this finding was decided to proceed with a sonohysterogram the results of the endometrial thickness. Prior to this cervix was cleansed with Betadine solution and a Pipelle was introduced into the uterine cavity endometrial biopsy. The cervix required dilatation and now very little tissue was obtained and submitted for histological evaluation.  Patient was counseled for sonohysterogram: The cervix was cleansed solution a sterile catheter was introduced into the uterine cavity. Once again the cervix required dilatation since it was narrow. Normal saline was instilled into the uterine cavity. Anterior uterine polyp 17 x 6 x 30 mm was noted there, this would explaine the endometrial thickness. Patient will be scheduled for outpatient resectoscopic polypectomy. Literature information was provided.

## 2014-02-20 ENCOUNTER — Telehealth: Payer: Self-pay | Admitting: *Deleted

## 2014-02-20 ENCOUNTER — Other Ambulatory Visit: Payer: Self-pay | Admitting: Gynecology

## 2014-02-20 NOTE — Telephone Encounter (Signed)
Pt called requesting BX from [place] on 02/17/14, I left message on pt voicemail results not back yet.

## 2014-02-22 ENCOUNTER — Telehealth: Payer: Self-pay

## 2014-02-22 NOTE — Telephone Encounter (Signed)
Left message for patient to call me about scheduling surgery. 

## 2014-03-01 NOTE — Telephone Encounter (Signed)
Today we received a release forms for records to be sent to another [name] office for 2nd opinion regarding surgery. I will give patient a little time and check back with her at a later date.

## 2014-03-06 ENCOUNTER — Encounter: Payer: Self-pay | Admitting: Gynecology

## 2014-03-13 ENCOUNTER — Ambulatory Visit (INDEPENDENT_AMBULATORY_CARE_PROVIDER_SITE_OTHER): Payer: BC Managed Care – PPO | Admitting: Internal Medicine

## 2014-03-13 ENCOUNTER — Encounter: Payer: Self-pay | Admitting: Internal Medicine

## 2014-03-13 VITALS — BP 112/70 | HR 76 | Ht 64.25 in | Wt 134.1 lb

## 2014-03-13 DIAGNOSIS — K769 Liver disease, unspecified: Secondary | ICD-10-CM

## 2014-03-13 DIAGNOSIS — K7689 Other specified diseases of liver: Secondary | ICD-10-CM

## 2014-03-13 DIAGNOSIS — K868 Other specified diseases of pancreas: Secondary | ICD-10-CM

## 2014-03-13 DIAGNOSIS — Q453 Other congenital malformations of pancreas and pancreatic duct: Secondary | ICD-10-CM

## 2014-03-13 NOTE — Assessment & Plan Note (Signed)
Likely a hemangioma Recheck with [place]

## 2014-03-13 NOTE — Progress Notes (Signed)
   Subjective:    Patient ID: Samantha Webb, female    DOB: May 21, 1955, 58 y.o.   MRN: [id number]  HPI This is a very nice middle-aged white woman seen at the request of Dr. Toney Rakes after he noted that she had some abnormal liver and pancreas imaging on ultrasound in 2013. She was having some right upper quadrant pain at that time which is not really an issue now. Occasionally when she feels mildly constipated she will get that sensation. Her liver tests were normal. She had a 4 mm pancreatic duct and a very small lesion in the right lobe of the liver thought to be a cavernous hemangioma. An MRI was recommended but she did not have that because she's had stapes implant which is titanium. She subsequently changed primary care providers.  At this time she feels well and says that she had normal liver chemistries on her routine physicals to the best of her knowledge. Dr. Ernie Hew is her primary care physician. I will request those labs.  Medications, allergies, past medical history, past surgical history, family history and social history are reviewed and updated in the EMR.   Review of Systems + back pain and arthritis, night sweats, hearing difficulty All other ROS negative except as per HPI    Objective:   Physical Exam General:  Well-developed, well-nourished and in no acute distress Lungs: Clear to auscultation bilaterally. Heart:  S1S2, no rubs, murmurs, gallops. Abdomen:  soft, non-tender, no hepatosplenomegaly, hernia, or mass and BS+.  Extremities:   no edema Skin   no rash. Neuro:  A&O x 3.  Psych:  appropriate mood and  Affect.   Data Reviewed: Complete abdominal ultrasound 09/17/2011 1. Very mild dilatation of the pancreatic duct (4 mm in diameter, with the upper limits of normal being 3 mm in diameter). No other acute findings to account for the patient's symptoms. 2. There is an incidentally noted a small echogenic lesion in the left lobe of the liver, as detailed  above. This is statistically likely to represent a small cavernous hemangioma. However, the imaging appearance on ultrasound is nonspecific, and definitive characterization with MRI may be useful if clinically indicated as some small malignant lesions could have a similar appearance.      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 ER:[email or web address], MD Uvaldo Rising, MD

## 2014-03-13 NOTE — Assessment & Plan Note (Signed)
Seems unlikely to be a problem Will start f/u with [place]

## 2014-03-13 NOTE — Patient Instructions (Signed)
You have been scheduled for an abdominal ultrasound at [facility] (1st floor of hospital) on 05/08/14 at 9:00am. Please arrive 15 minutes prior to your appointment for registration. Make certain not to have anything to eat or drink after midnight.. Should you need to reschedule your appointment, please contact radiology at [phone]. This test typically takes about 30 minutes to perform.  I appreciate the opportunity to care for you.

## 2014-05-08 ENCOUNTER — Ambulatory Visit (HOSPITAL_COMMUNITY)
Admission: RE | Admit: 2014-05-08 | Discharge: 2014-05-08 | Disposition: A | Discharge status: Home / Self Care | Payer: BC Managed Care – PPO | Source: Ambulatory Visit | Attending: Internal Medicine | Admitting: Internal Medicine

## 2014-05-08 DIAGNOSIS — K769 Liver disease, unspecified: Secondary | ICD-10-CM | POA: Diagnosis not present

## 2014-05-09 ENCOUNTER — Telehealth: Payer: Self-pay | Admitting: Internal Medicine

## 2014-05-09 NOTE — Telephone Encounter (Signed)
Left message for patient to call back See results note for additional details

## 2014-05-09 NOTE — Progress Notes (Signed)
Quick Note:  Small liver lesion is stable and consistent with a benign hemangioma Pancreatic duct is ok  ______

## 2014-05-09 NOTE — Telephone Encounter (Signed)
Dr. Carlean Purl will you please review [place] and advise.

## 2014-05-09 NOTE — Telephone Encounter (Signed)
Result note written and released to My Chart also Stable liver lesion c/w hemangioma and pancreatic duct is ok No further f/u of this needed

## 2014-05-10 ENCOUNTER — Telehealth: Payer: Self-pay

## 2014-05-10 NOTE — Telephone Encounter (Signed)
I called patient and left a message for her to call me.  I told her I had left message for her to call me back on 02/22/14 and did not hear from her but shortly after that we received a records release form for records to be sent to another MD for a 2nd opinion regarding surgery.  I told her I was just following up on the status of that surgery that was recommended by Dr. Moshe Salisbury to see if she had it performed elsewhere or if she still wanted to get it scheduled with Dr. Moshe Salisbury.  I asked her to call me back.

## 2014-06-02 NOTE — Progress Notes (Signed)
I sent Dr. Moshe Salisbury a staff message regarding this patient as follows. Nothing else we can do at the present time you left her several messages. We will wait to see if she calls [place].       Previous Messages     ----- Message -----   From: Sandi Mariscal, RMA   Sent: 06/02/2014 11:41 AM    To: Terrance Mass, MD   Back at end of Oct you sent me her chart for resectoscopic polypectomy. On 02/22/14 I left message for her to call but she did not. Within a week we received a release form from Her for records to Dr. Ronita Hipps for 2nd opinion regarding surgery.   I never heard from her and left another message to call on 05/10/14. Still she has not called me. How would you like me to proceed?

## 2014-08-28 ENCOUNTER — Ambulatory Visit
Admission: RE | Admit: 2014-08-28 | Discharge: 2014-08-28 | Disposition: A | Discharge status: Home / Self Care | Payer: BLUE CROSS/BLUE SHIELD | Source: Ambulatory Visit | Attending: Family Medicine | Admitting: Family Medicine

## 2014-08-28 ENCOUNTER — Other Ambulatory Visit: Payer: Self-pay | Admitting: Family Medicine

## 2014-08-28 DIAGNOSIS — R109 Unspecified abdominal pain: Secondary | ICD-10-CM

## 2014-11-17 ENCOUNTER — Telehealth: Payer: Self-pay | Admitting: Internal Medicine

## 2014-11-17 NOTE — Telephone Encounter (Signed)
Patient reports severe right upper quadrant pain this am with nausea and dizziness.  She reports a 1 month history of dull upper abdominal pain for the last month, but this am its different.  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  She contacted her primary care and they recommended that she see Urgent Care or call us.  She is advised that I don't have any openings today and I recommend she proceed to Urgent care. She will call back if she needs GI follow up and I will have her see a PA next week

## 2015-03-09 ENCOUNTER — Other Ambulatory Visit: Payer: Self-pay | Admitting: Family Medicine

## 2015-03-09 DIAGNOSIS — D259 Leiomyoma of uterus, unspecified: Secondary | ICD-10-CM

## 2015-03-13 ENCOUNTER — Ambulatory Visit
Admission: RE | Admit: 2015-03-13 | Discharge: 2015-03-13 | Disposition: A | Discharge status: Home / Self Care | Payer: BLUE CROSS/BLUE SHIELD | Source: Ambulatory Visit | Attending: Family Medicine | Admitting: Family Medicine

## 2015-03-13 DIAGNOSIS — D259 Leiomyoma of uterus, unspecified: Secondary | ICD-10-CM

## 2015-09-11 DIAGNOSIS — M2170 Unequal limb length (acquired), unspecified site: Secondary | ICD-10-CM | POA: Diagnosis not present

## 2015-09-11 DIAGNOSIS — M1711 Unilateral primary osteoarthritis, right knee: Secondary | ICD-10-CM | POA: Diagnosis not present

## 2015-09-11 DIAGNOSIS — Z6825 Body mass index (BMI) 25.0-25.9, adult: Secondary | ICD-10-CM | POA: Diagnosis not present

## 2015-09-11 DIAGNOSIS — B009 Herpesviral infection, unspecified: Secondary | ICD-10-CM | POA: Diagnosis not present

## 2015-10-30 DIAGNOSIS — M1711 Unilateral primary osteoarthritis, right knee: Secondary | ICD-10-CM | POA: Diagnosis not present

## 2015-10-30 DIAGNOSIS — M7542 Impingement syndrome of left shoulder: Secondary | ICD-10-CM | POA: Diagnosis not present

## 2015-10-30 DIAGNOSIS — M549 Dorsalgia, unspecified: Secondary | ICD-10-CM | POA: Diagnosis not present

## 2015-10-30 DIAGNOSIS — L309 Dermatitis, unspecified: Secondary | ICD-10-CM | POA: Diagnosis not present

## 2015-12-10 DIAGNOSIS — Z0000 Encounter for general adult medical examination without abnormal findings: Secondary | ICD-10-CM | POA: Diagnosis not present

## 2015-12-10 DIAGNOSIS — Z0070 Encounter for examination for period of delayed growth in childhood without abnormal findings: Secondary | ICD-10-CM | POA: Diagnosis not present

## 2015-12-12 DIAGNOSIS — Z23 Encounter for immunization: Secondary | ICD-10-CM | POA: Diagnosis not present

## 2015-12-12 DIAGNOSIS — Z1211 Encounter for screening for malignant neoplasm of colon: Secondary | ICD-10-CM | POA: Diagnosis not present

## 2015-12-12 DIAGNOSIS — Z0000 Encounter for general adult medical examination without abnormal findings: Secondary | ICD-10-CM | POA: Diagnosis not present

## 2016-04-02 DIAGNOSIS — Z6826 Body mass index (BMI) 26.0-26.9, adult: Secondary | ICD-10-CM | POA: Diagnosis not present

## 2016-04-02 DIAGNOSIS — Z1231 Encounter for screening mammogram for malignant neoplasm of breast: Secondary | ICD-10-CM | POA: Diagnosis not present

## 2016-04-02 DIAGNOSIS — Z01419 Encounter for gynecological examination (general) (routine) without abnormal findings: Secondary | ICD-10-CM | POA: Diagnosis not present

## 2016-06-04 DIAGNOSIS — B349 Viral infection, unspecified: Secondary | ICD-10-CM | POA: Diagnosis not present

## 2016-06-04 DIAGNOSIS — Z6827 Body mass index (BMI) 27.0-27.9, adult: Secondary | ICD-10-CM | POA: Diagnosis not present

## 2016-06-04 DIAGNOSIS — J069 Acute upper respiratory infection, unspecified: Secondary | ICD-10-CM | POA: Diagnosis not present

## 2016-06-25 DIAGNOSIS — M2170 Unequal limb length (acquired), unspecified site: Secondary | ICD-10-CM | POA: Diagnosis not present

## 2016-06-25 DIAGNOSIS — M79604 Pain in right leg: Secondary | ICD-10-CM | POA: Diagnosis not present

## 2016-06-25 DIAGNOSIS — Z6827 Body mass index (BMI) 27.0-27.9, adult: Secondary | ICD-10-CM | POA: Diagnosis not present

## 2016-06-25 DIAGNOSIS — Z23 Encounter for immunization: Secondary | ICD-10-CM | POA: Diagnosis not present

## 2016-06-25 DIAGNOSIS — R030 Elevated blood-pressure reading, without diagnosis of hypertension: Secondary | ICD-10-CM | POA: Diagnosis not present

## 2016-09-17 ENCOUNTER — Encounter: Payer: Self-pay | Admitting: Gynecology

## 2016-12-10 DIAGNOSIS — Z13220 Encounter for screening for lipoid disorders: Secondary | ICD-10-CM | POA: Diagnosis not present

## 2016-12-10 DIAGNOSIS — Z114 Encounter for screening for human immunodeficiency virus [HIV]: Secondary | ICD-10-CM | POA: Diagnosis not present

## 2016-12-10 DIAGNOSIS — Z0000 Encounter for general adult medical examination without abnormal findings: Secondary | ICD-10-CM | POA: Diagnosis not present

## 2016-12-10 DIAGNOSIS — Z1329 Encounter for screening for other suspected endocrine disorder: Secondary | ICD-10-CM | POA: Diagnosis not present

## 2016-12-12 DIAGNOSIS — K130 Diseases of lips: Secondary | ICD-10-CM | POA: Diagnosis not present

## 2016-12-15 DIAGNOSIS — Z0000 Encounter for general adult medical examination without abnormal findings: Secondary | ICD-10-CM | POA: Diagnosis not present

## 2016-12-15 DIAGNOSIS — Z1211 Encounter for screening for malignant neoplasm of colon: Secondary | ICD-10-CM | POA: Diagnosis not present

## 2017-02-10 DIAGNOSIS — T22019A Burn of unspecified degree of unspecified forearm, initial encounter: Secondary | ICD-10-CM | POA: Diagnosis not present

## 2017-02-10 DIAGNOSIS — Z6827 Body mass index (BMI) 27.0-27.9, adult: Secondary | ICD-10-CM | POA: Diagnosis not present

## 2017-04-09 DIAGNOSIS — Z6826 Body mass index (BMI) 26.0-26.9, adult: Secondary | ICD-10-CM | POA: Diagnosis not present

## 2017-04-09 DIAGNOSIS — Z01419 Encounter for gynecological examination (general) (routine) without abnormal findings: Secondary | ICD-10-CM | POA: Diagnosis not present

## 2017-04-09 DIAGNOSIS — Z1151 Encounter for screening for human papillomavirus (HPV): Secondary | ICD-10-CM | POA: Diagnosis not present

## 2017-04-09 DIAGNOSIS — Z1231 Encounter for screening mammogram for malignant neoplasm of breast: Secondary | ICD-10-CM | POA: Diagnosis not present

## 2017-06-08 DIAGNOSIS — Z6827 Body mass index (BMI) 27.0-27.9, adult: Secondary | ICD-10-CM | POA: Diagnosis not present

## 2017-06-08 DIAGNOSIS — R1011 Right upper quadrant pain: Secondary | ICD-10-CM | POA: Diagnosis not present

## 2017-06-09 ENCOUNTER — Other Ambulatory Visit: Payer: Self-pay | Admitting: Family Medicine

## 2017-06-09 DIAGNOSIS — R1011 Right upper quadrant pain: Secondary | ICD-10-CM

## 2017-06-22 ENCOUNTER — Ambulatory Visit
Admission: RE | Admit: 2017-06-22 | Discharge: 2017-06-22 | Disposition: A | Discharge status: Home / Self Care | Payer: BLUE CROSS/BLUE SHIELD | Source: Ambulatory Visit | Attending: Family Medicine | Admitting: Family Medicine

## 2017-06-22 DIAGNOSIS — R1011 Right upper quadrant pain: Secondary | ICD-10-CM | POA: Diagnosis not present

## 2017-11-25 ENCOUNTER — Other Ambulatory Visit: Payer: Self-pay | Admitting: Family Medicine

## 2017-11-25 DIAGNOSIS — R1011 Right upper quadrant pain: Secondary | ICD-10-CM

## 2017-11-25 DIAGNOSIS — Z6826 Body mass index (BMI) 26.0-26.9, adult: Secondary | ICD-10-CM | POA: Diagnosis not present

## 2017-11-25 DIAGNOSIS — K219 Gastro-esophageal reflux disease without esophagitis: Secondary | ICD-10-CM | POA: Diagnosis not present

## 2017-11-25 DIAGNOSIS — K5900 Constipation, unspecified: Secondary | ICD-10-CM | POA: Diagnosis not present

## 2017-12-09 ENCOUNTER — Ambulatory Visit
Admission: RE | Admit: 2017-12-09 | Discharge: 2017-12-09 | Disposition: A | Discharge status: Home / Self Care | Payer: BLUE CROSS/BLUE SHIELD | Source: Ambulatory Visit | Attending: Family Medicine | Admitting: Family Medicine

## 2017-12-09 DIAGNOSIS — N2889 Other specified disorders of kidney and ureter: Secondary | ICD-10-CM | POA: Diagnosis not present

## 2017-12-09 DIAGNOSIS — R1011 Right upper quadrant pain: Secondary | ICD-10-CM

## 2017-12-16 ENCOUNTER — Other Ambulatory Visit: Payer: Self-pay | Admitting: Family Medicine

## 2017-12-16 DIAGNOSIS — Z1329 Encounter for screening for other suspected endocrine disorder: Secondary | ICD-10-CM | POA: Diagnosis not present

## 2017-12-16 DIAGNOSIS — K8689 Other specified diseases of pancreas: Secondary | ICD-10-CM

## 2017-12-16 DIAGNOSIS — Z6826 Body mass index (BMI) 26.0-26.9, adult: Secondary | ICD-10-CM | POA: Diagnosis not present

## 2017-12-16 DIAGNOSIS — R1011 Right upper quadrant pain: Secondary | ICD-10-CM

## 2017-12-16 DIAGNOSIS — Z0000 Encounter for general adult medical examination without abnormal findings: Secondary | ICD-10-CM | POA: Diagnosis not present

## 2017-12-16 DIAGNOSIS — N289 Disorder of kidney and ureter, unspecified: Secondary | ICD-10-CM

## 2017-12-16 DIAGNOSIS — K869 Disease of pancreas, unspecified: Secondary | ICD-10-CM | POA: Diagnosis not present

## 2017-12-16 DIAGNOSIS — Z114 Encounter for screening for human immunodeficiency virus [HIV]: Secondary | ICD-10-CM | POA: Diagnosis not present

## 2017-12-16 DIAGNOSIS — Z13220 Encounter for screening for lipoid disorders: Secondary | ICD-10-CM | POA: Diagnosis not present

## 2017-12-16 DIAGNOSIS — K219 Gastro-esophageal reflux disease without esophagitis: Secondary | ICD-10-CM | POA: Diagnosis not present

## 2017-12-21 DIAGNOSIS — Z6826 Body mass index (BMI) 26.0-26.9, adult: Secondary | ICD-10-CM | POA: Diagnosis not present

## 2017-12-21 DIAGNOSIS — Z1211 Encounter for screening for malignant neoplasm of colon: Secondary | ICD-10-CM | POA: Diagnosis not present

## 2017-12-21 DIAGNOSIS — Z0000 Encounter for general adult medical examination without abnormal findings: Secondary | ICD-10-CM | POA: Diagnosis not present

## 2017-12-28 ENCOUNTER — Other Ambulatory Visit: Payer: BLUE CROSS/BLUE SHIELD

## 2018-04-21 DIAGNOSIS — Z1231 Encounter for screening mammogram for malignant neoplasm of breast: Secondary | ICD-10-CM | POA: Diagnosis not present

## 2018-04-26 DIAGNOSIS — H906 Mixed conductive and sensorineural hearing loss, bilateral: Secondary | ICD-10-CM | POA: Diagnosis not present

## 2018-04-26 DIAGNOSIS — H8011 Otosclerosis involving oval window, obliterative, right ear: Secondary | ICD-10-CM | POA: Diagnosis not present

## 2018-06-02 ENCOUNTER — Ambulatory Visit
Admission: RE | Admit: 2018-06-02 | Discharge: 2018-06-02 | Disposition: A | Discharge status: Home / Self Care | Payer: BLUE CROSS/BLUE SHIELD | Source: Ambulatory Visit | Attending: Family Medicine | Admitting: Family Medicine

## 2018-06-02 ENCOUNTER — Other Ambulatory Visit: Payer: Self-pay | Admitting: Family Medicine

## 2018-06-02 DIAGNOSIS — Z01818 Encounter for other preprocedural examination: Secondary | ICD-10-CM

## 2018-06-02 DIAGNOSIS — H8090 Unspecified otosclerosis, unspecified ear: Secondary | ICD-10-CM | POA: Diagnosis not present

## 2018-06-02 DIAGNOSIS — Z6827 Body mass index (BMI) 27.0-27.9, adult: Secondary | ICD-10-CM | POA: Diagnosis not present

## 2018-06-02 DIAGNOSIS — Z23 Encounter for immunization: Secondary | ICD-10-CM | POA: Diagnosis not present

## 2018-06-14 DIAGNOSIS — H906 Mixed conductive and sensorineural hearing loss, bilateral: Secondary | ICD-10-CM | POA: Diagnosis not present

## 2018-06-14 DIAGNOSIS — H8011 Otosclerosis involving oval window, obliterative, right ear: Secondary | ICD-10-CM | POA: Diagnosis not present

## 2018-07-01 DIAGNOSIS — Z01419 Encounter for gynecological examination (general) (routine) without abnormal findings: Secondary | ICD-10-CM | POA: Diagnosis not present

## 2018-07-01 DIAGNOSIS — Z6826 Body mass index (BMI) 26.0-26.9, adult: Secondary | ICD-10-CM | POA: Diagnosis not present

## 2018-07-12 ENCOUNTER — Other Ambulatory Visit: Payer: Self-pay | Admitting: Otolaryngology

## 2018-07-12 DIAGNOSIS — H8011 Otosclerosis involving oval window, obliterative, right ear: Secondary | ICD-10-CM | POA: Diagnosis not present

## 2018-07-12 DIAGNOSIS — H908 Mixed conductive and sensorineural hearing loss, unspecified: Secondary | ICD-10-CM | POA: Diagnosis not present

## 2018-07-12 DIAGNOSIS — H906 Mixed conductive and sensorineural hearing loss, bilateral: Secondary | ICD-10-CM | POA: Diagnosis not present

## 2018-07-12 DIAGNOSIS — H8091 Unspecified otosclerosis, right ear: Secondary | ICD-10-CM | POA: Diagnosis not present

## 2018-07-19 DIAGNOSIS — H906 Mixed conductive and sensorineural hearing loss, bilateral: Secondary | ICD-10-CM | POA: Diagnosis not present

## 2018-07-19 DIAGNOSIS — H8003 Otosclerosis involving oval window, nonobliterative, bilateral: Secondary | ICD-10-CM | POA: Diagnosis not present

## 2018-08-01 DIAGNOSIS — N390 Urinary tract infection, site not specified: Secondary | ICD-10-CM | POA: Diagnosis not present

## 2018-08-01 DIAGNOSIS — H8090 Unspecified otosclerosis, unspecified ear: Secondary | ICD-10-CM | POA: Diagnosis not present

## 2018-08-01 DIAGNOSIS — B373 Candidiasis of vulva and vagina: Secondary | ICD-10-CM | POA: Diagnosis not present

## 2018-08-01 DIAGNOSIS — Z719 Counseling, unspecified: Secondary | ICD-10-CM | POA: Diagnosis not present

## 2018-08-04 DIAGNOSIS — N390 Urinary tract infection, site not specified: Secondary | ICD-10-CM | POA: Diagnosis not present

## 2018-08-09 DIAGNOSIS — H8003 Otosclerosis involving oval window, nonobliterative, bilateral: Secondary | ICD-10-CM | POA: Diagnosis not present

## 2018-08-09 DIAGNOSIS — H906 Mixed conductive and sensorineural hearing loss, bilateral: Secondary | ICD-10-CM | POA: Diagnosis not present

## 2018-09-12 IMAGING — US US ABDOMEN LIMITED
1 series · 14 of 25 positions shown · non-contrast
Comparison: 05/08/2014 and 09/17/2011 ultrasounds

CLINICAL DATA: 61-year-old female with right upper quadrant
abdominal pain for 1 month.

EXAM:
ULTRASOUND ABDOMEN LIMITED RIGHT UPPER QUADRANT

[Series 1: us abdomen limited · 0.17mm/px · 14 of 37 slices shown]
[im 1/37]
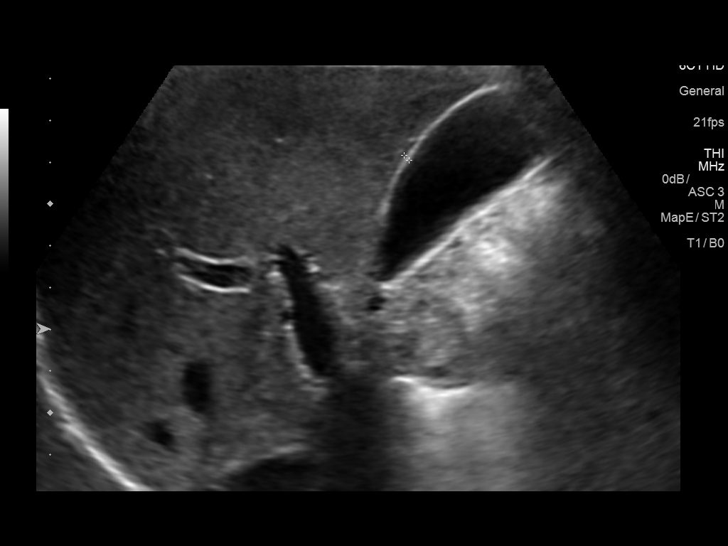
[im 4/37]
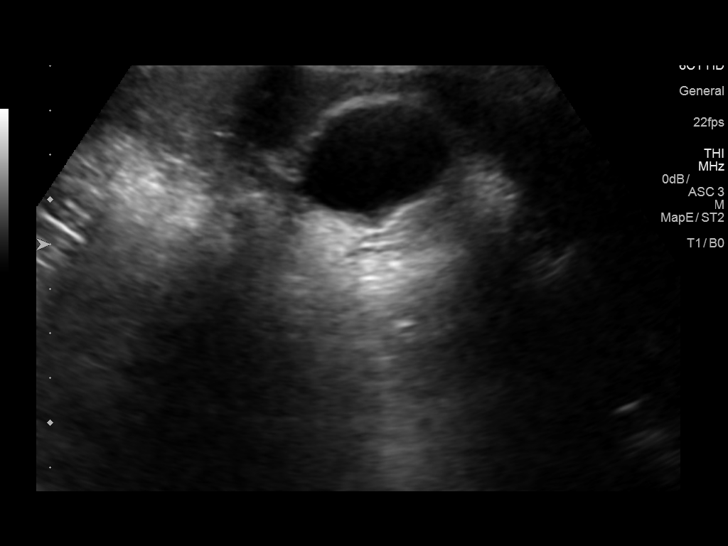
[im 7/37]
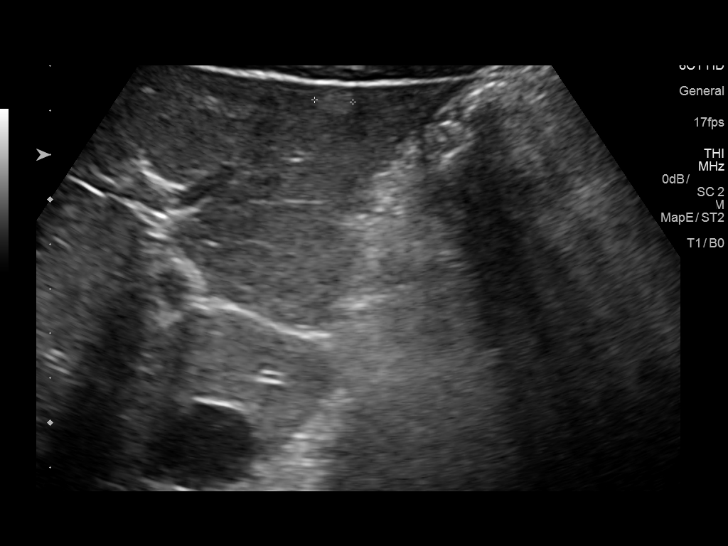
[im 10/37]
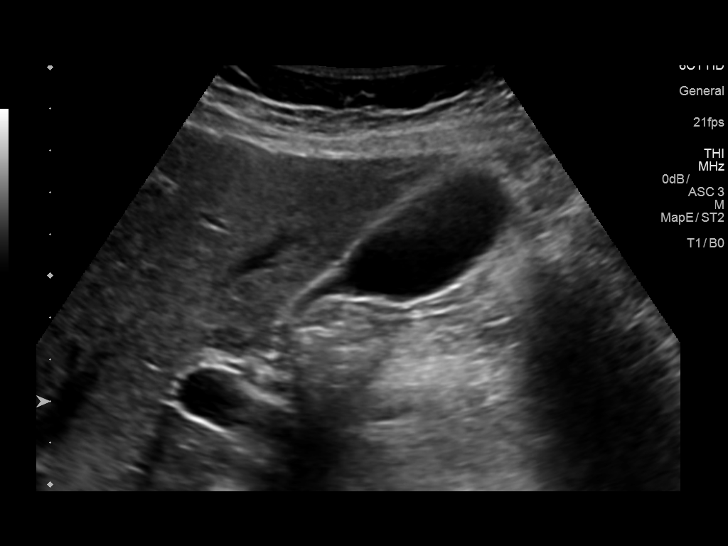
[im 13/37]
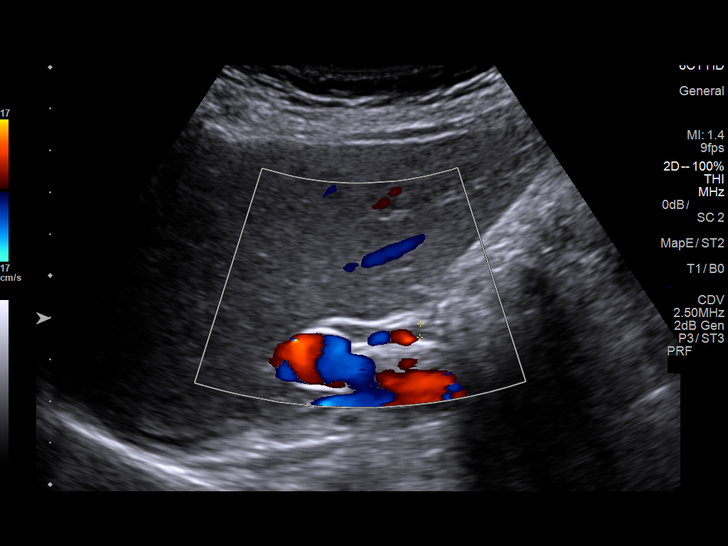
[im 14/37]
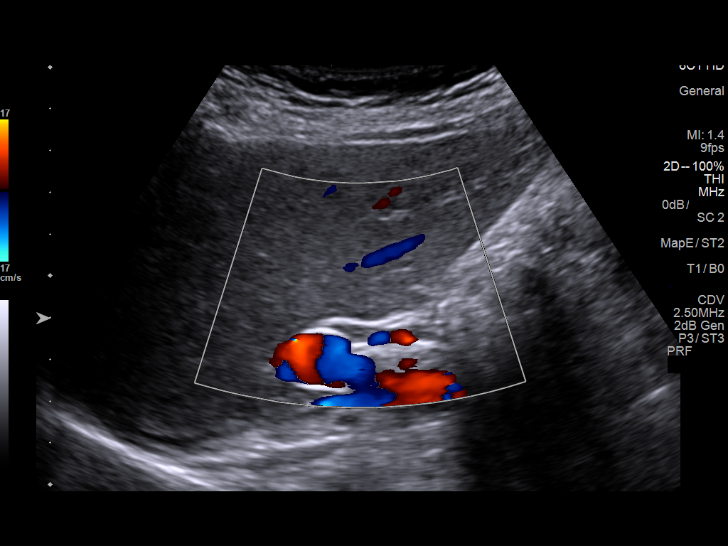
[im 17/37]
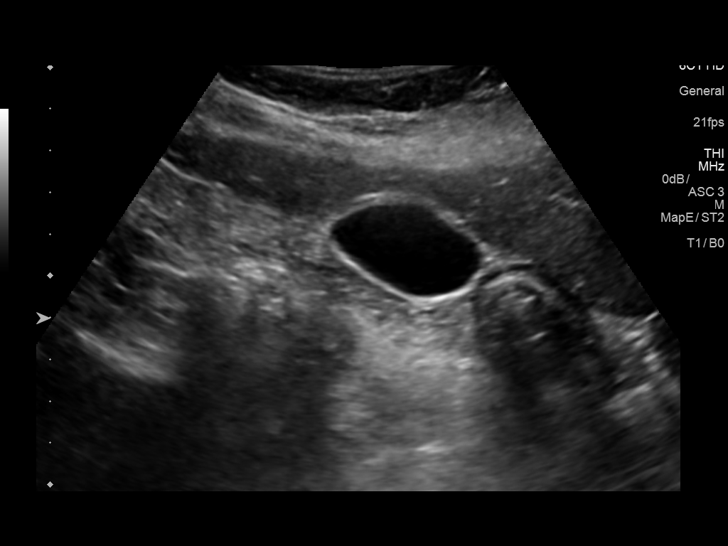
[im 20/37]
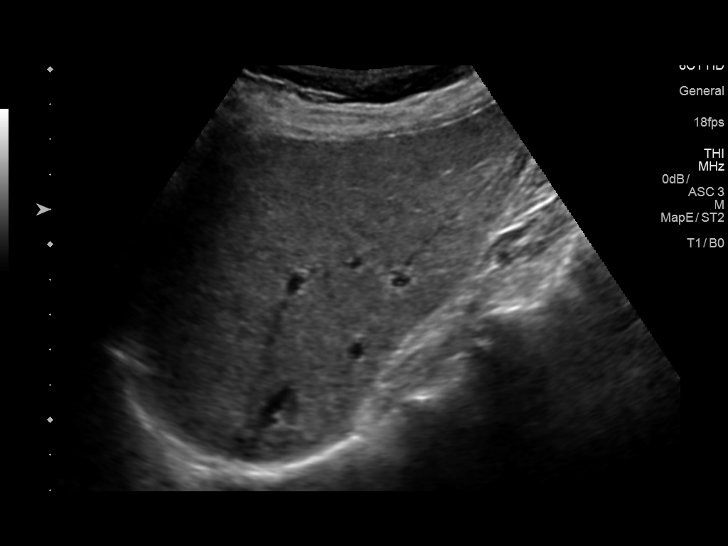
[im 23/37]
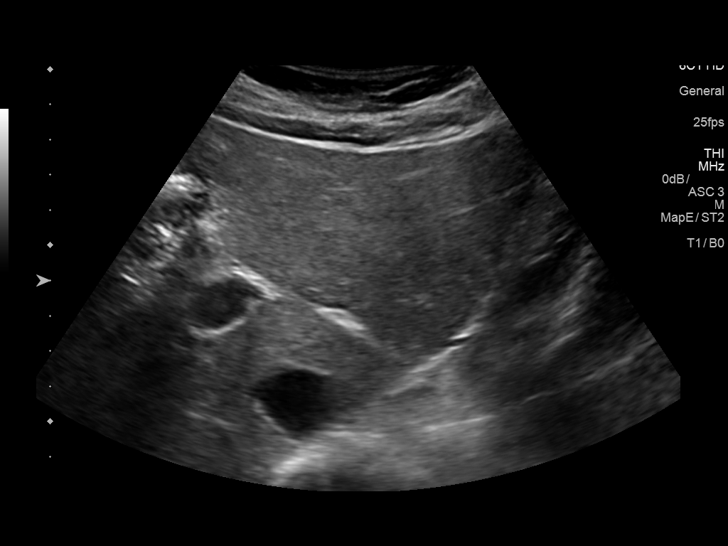
[im 25/37]
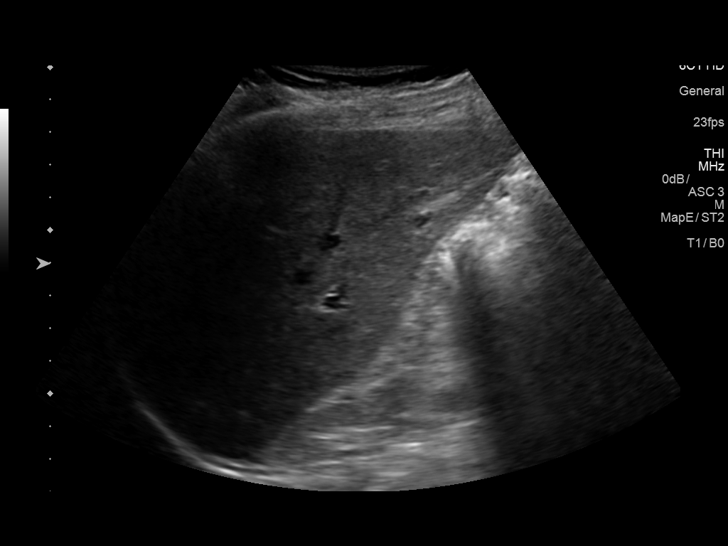
[im 28/37]
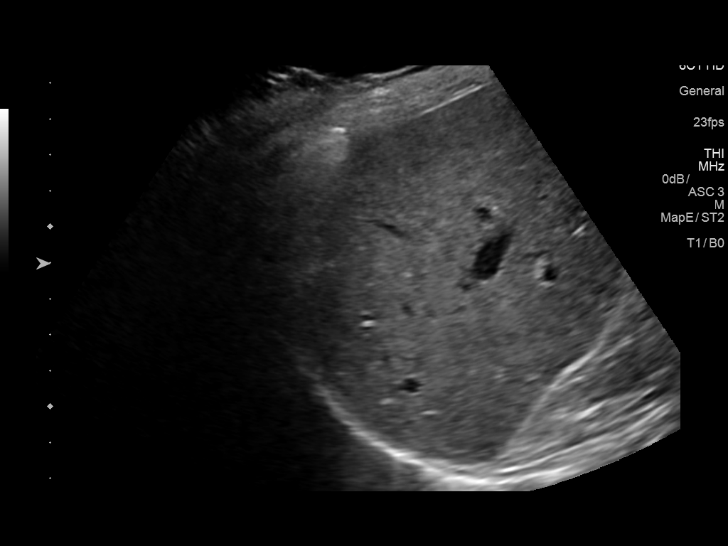
[im 31/37]
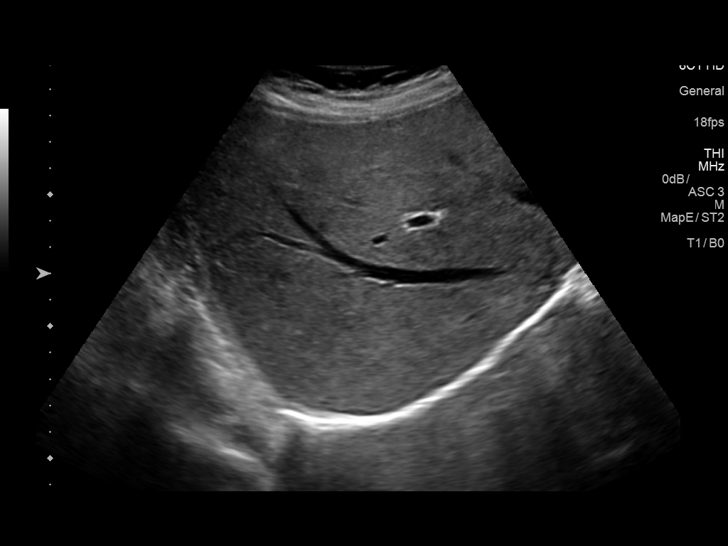
[im 34/37]
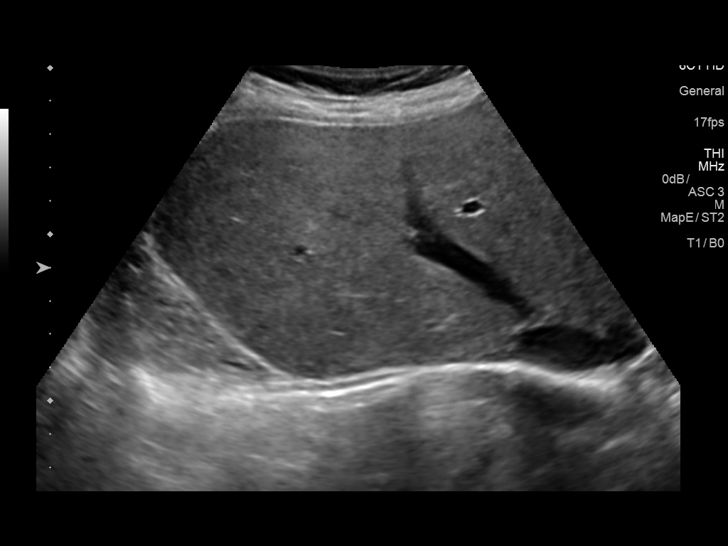
[im 37/37]
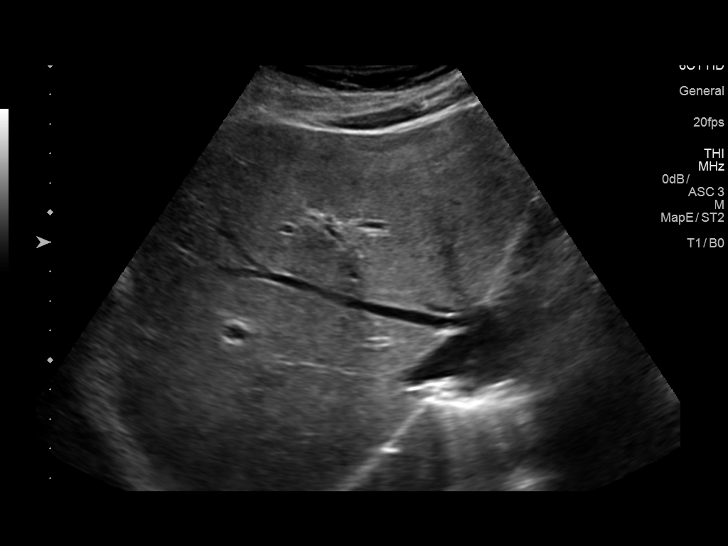

[14 of 25 positions shown; findings below may reference images not displayed]

FINDINGS: Gallbladder:

The gallbladder is unremarkable. There is no evidence of
cholelithiasis or acute cholecystitis.

Common bile duct:

Diameter: 4.2 mm. No intrahepatic or extrahepatic biliary
dilatation.

Liver:

A stable 1.1 cm hyperechoic left hepatic lesion is unchanged from
4077-benign. No other hepatic abnormalities are present. Portal vein
is patent on color Doppler imaging with normal direction of blood
flow towards the liver.
IMPRESSION: 1. No acute or significant abnormalities.  Normal gallbladder.

## 2018-11-03 DIAGNOSIS — Z23 Encounter for immunization: Secondary | ICD-10-CM | POA: Diagnosis not present

## 2018-11-23 DIAGNOSIS — Z733 Stress, not elsewhere classified: Secondary | ICD-10-CM | POA: Diagnosis not present

## 2018-11-23 DIAGNOSIS — B009 Herpesviral infection, unspecified: Secondary | ICD-10-CM | POA: Diagnosis not present

## 2018-11-23 DIAGNOSIS — Z719 Counseling, unspecified: Secondary | ICD-10-CM | POA: Diagnosis not present

## 2019-01-27 DIAGNOSIS — N952 Postmenopausal atrophic vaginitis: Secondary | ICD-10-CM | POA: Diagnosis not present

## 2019-01-27 DIAGNOSIS — N390 Urinary tract infection, site not specified: Secondary | ICD-10-CM | POA: Diagnosis not present

## 2019-01-27 DIAGNOSIS — Z719 Counseling, unspecified: Secondary | ICD-10-CM | POA: Diagnosis not present

## 2019-03-01 IMAGING — US US ABDOMEN COMPLETE
1 series · 13 of 25 positions shown · non-contrast
Comparison: 06/22/2017 ultrasound

CLINICAL DATA: Right upper quadrant abdominal pain intermittently
for 2 months.

EXAM:
ABDOMEN ULTRASOUND COMPLETE

[Series 1: us abdomen complete · 0.18mm/px · 13 of 96 slices shown]
[im 1/96]
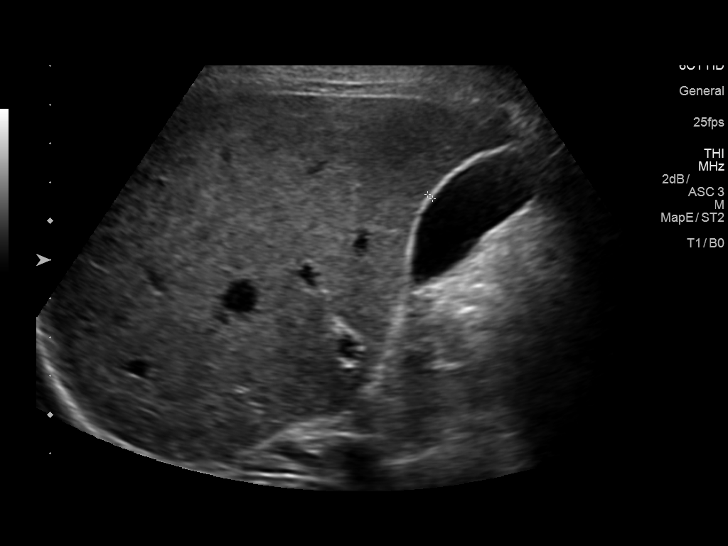
[im 8/96]
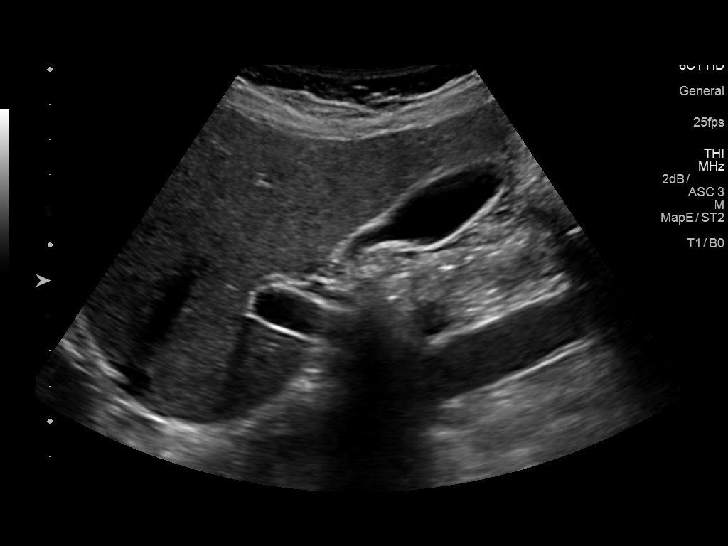
[im 16/96]
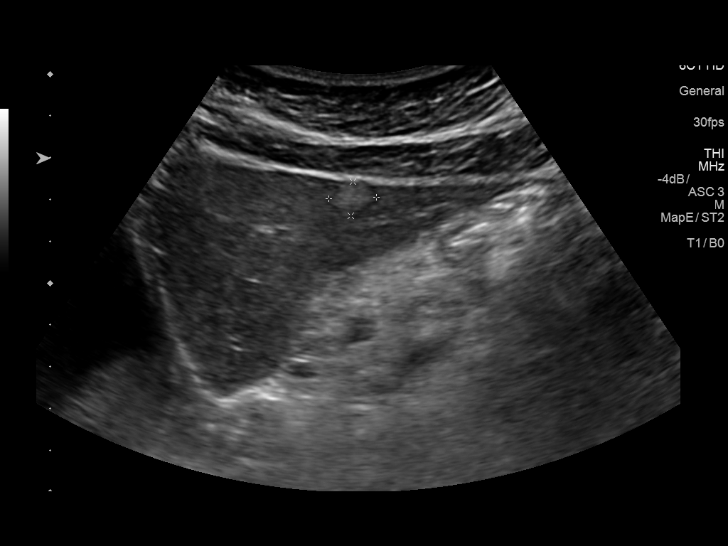
[im 24/96]
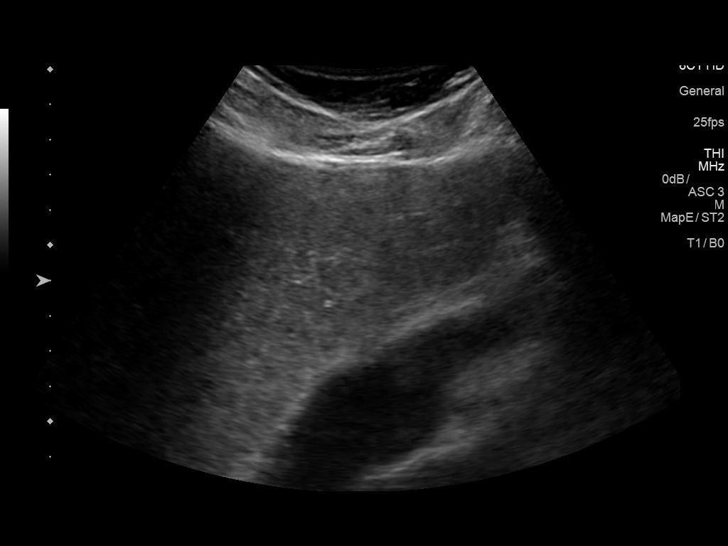
[im 32/96]
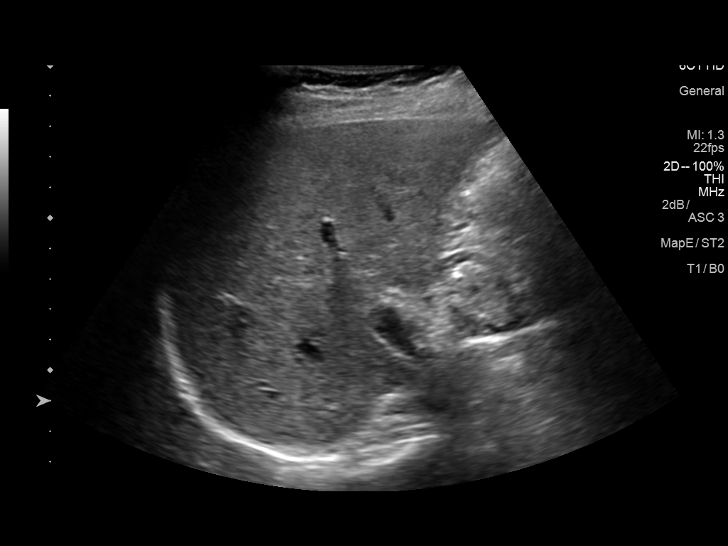
[im 40/96]
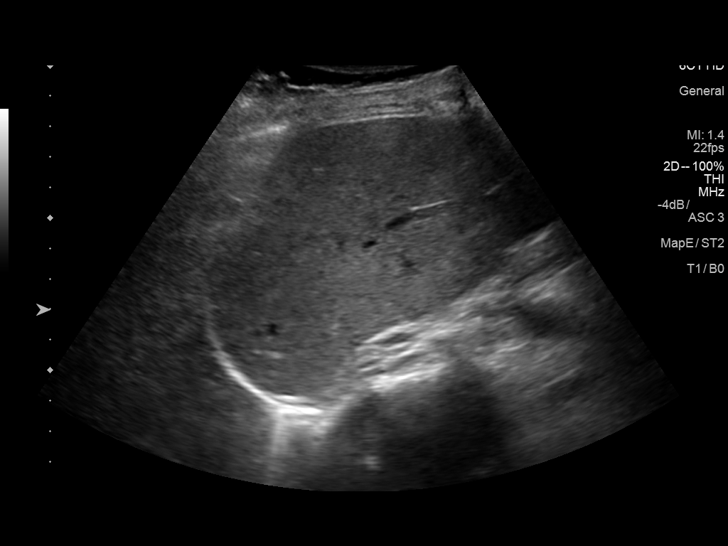
[im 48/96]
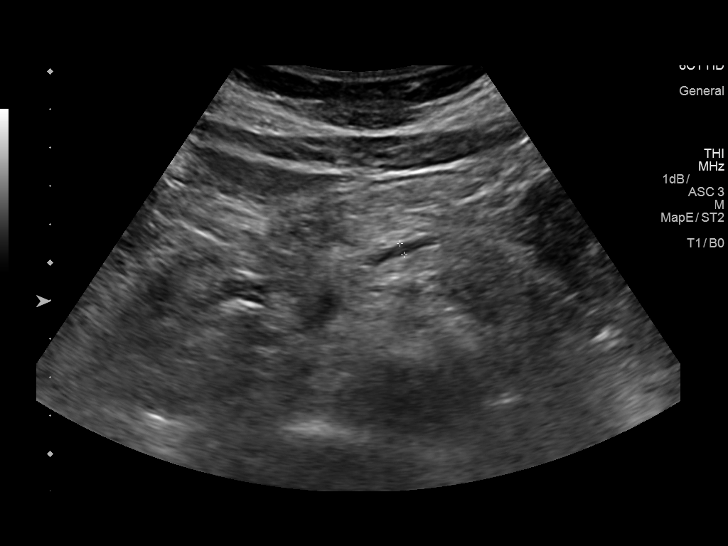
[im 56/96]
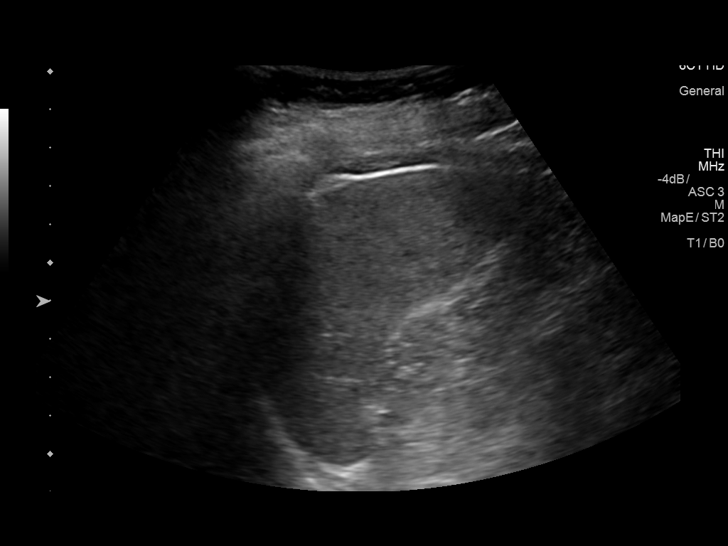
[im 64/96]
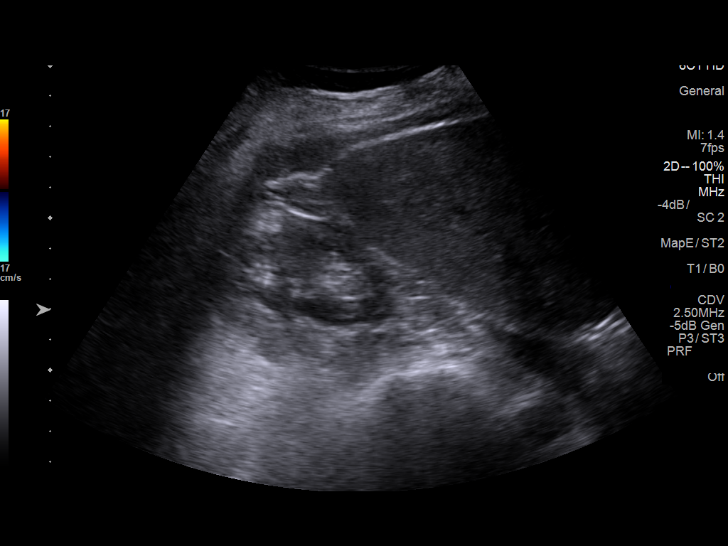
[im 72/96]
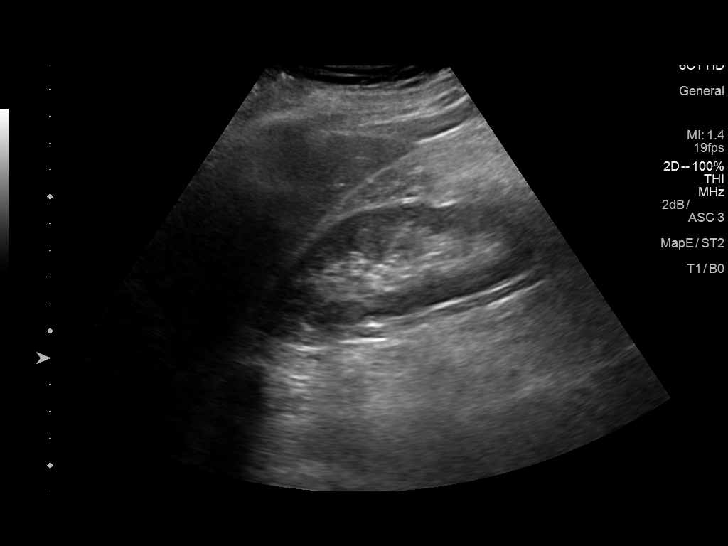
[im 80/96]
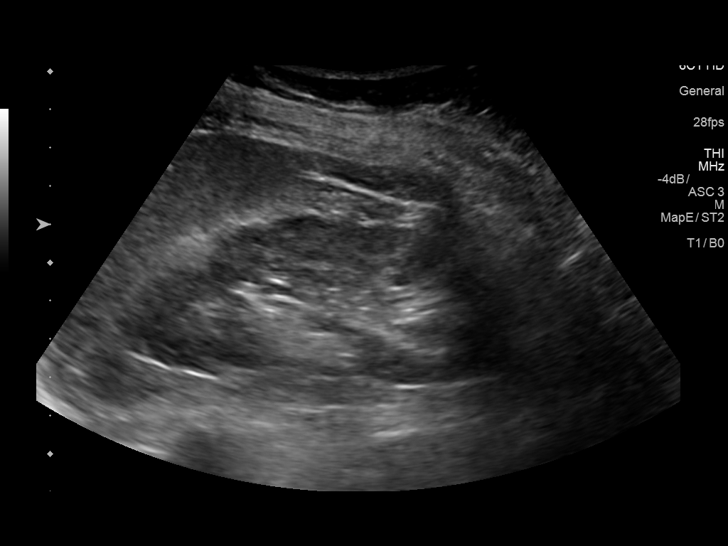
[im 88/96]
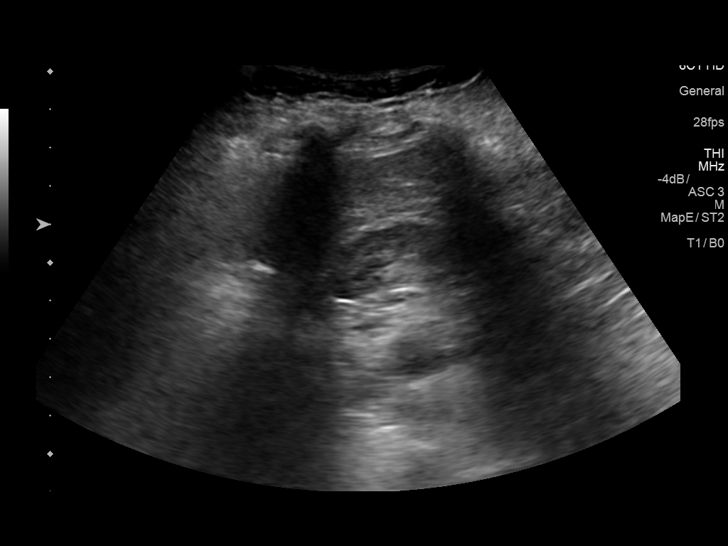
[im 96/96]
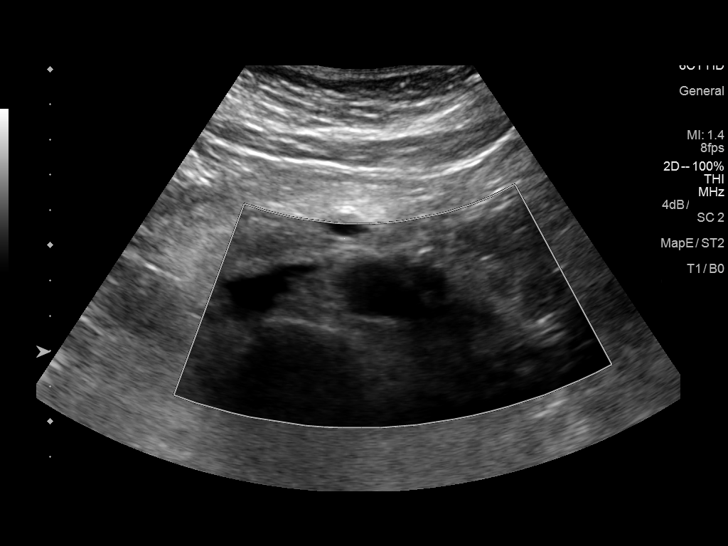

[13 of 25 positions shown; findings below may reference images not displayed]

FINDINGS: Gallbladder: No gallstones or wall thickening visualized. No
sonographic Murphy sign noted by sonographer.

Common bile duct: Diameter: 4 mm

Liver: 1.4 by 0.8 by 0.9 cm echogenic lesion in the left hepatic
lobe anteriorly as on image 22, without recognizable accentuated
through transmission. Within normal limits in parenchymal
echogenicity. Portal vein is patent on color Doppler imaging with
normal direction of blood flow towards the liver.

IVC: No abnormality visualized.

Pancreas: The dorsal pancreatic duct in the pancreatic body appears
dilated at 4 mm in diameter. Significant portions the pancreatic
head and tail were not well seen due to overlying bowel gas.

Spleen: Size and appearance within normal limits.

Right Kidney: Length: 10.9 cm. Echogenicity within normal limits.
Echogenic lesion in the right kidney noted measuring 0.9 by 0.8 by
0.4 cm, potentially hypervascular, difficult to localize but
possibly in the upper pole. No hydronephrosis.

Left Kidney: Length: 10.9 cm. Echogenicity within normal limits. No
mass or hydronephrosis visualized.

Abdominal aorta: No aneurysm visualized. Nonvisualization of the
aortic bifurcation due to overlying bowel gas.

Other findings: None.
IMPRESSION: 1. Dilated dorsal pancreatic duct at 4 mm in diameter. This is
abnormal for age, and warrants further investigation. I would
suggest pancreatic protocol MRI with and without contrast to rule
out otherwise occult pancreatic lesion.
2. Approximately 1 cm echogenic lesion in the left hepatic lobe
anteriorly, unchanged from 3409, favoring a benign etiology such as
hemangioma.
3. Echogenic lesion in the right kidney, probably hypervascular,
measuring up to 0.9 cm in long axis. Possible angiomyolipoma given
the hypervascularity and hyperechoic appearance, but overall
nonspecific. This also merits further observation and could be
assessed at the time of pancreatic protocol MRI with and without
contrast.

## 2019-05-30 DIAGNOSIS — Z1231 Encounter for screening mammogram for malignant neoplasm of breast: Secondary | ICD-10-CM | POA: Diagnosis not present

## 2019-09-07 DIAGNOSIS — M545 Low back pain: Secondary | ICD-10-CM | POA: Diagnosis not present

## 2019-09-07 DIAGNOSIS — R197 Diarrhea, unspecified: Secondary | ICD-10-CM | POA: Diagnosis not present

## 2019-09-07 DIAGNOSIS — R319 Hematuria, unspecified: Secondary | ICD-10-CM | POA: Diagnosis not present

## 2019-12-12 DIAGNOSIS — Z6827 Body mass index (BMI) 27.0-27.9, adult: Secondary | ICD-10-CM | POA: Diagnosis not present

## 2019-12-12 DIAGNOSIS — B009 Herpesviral infection, unspecified: Secondary | ICD-10-CM | POA: Diagnosis not present

## 2019-12-12 DIAGNOSIS — E782 Mixed hyperlipidemia: Secondary | ICD-10-CM | POA: Diagnosis not present

## 2019-12-12 DIAGNOSIS — M5030 Other cervical disc degeneration, unspecified cervical region: Secondary | ICD-10-CM | POA: Diagnosis not present

## 2019-12-12 DIAGNOSIS — Z1159 Encounter for screening for other viral diseases: Secondary | ICD-10-CM | POA: Diagnosis not present

## 2020-02-11 DIAGNOSIS — S91002A Unspecified open wound, left ankle, initial encounter: Secondary | ICD-10-CM | POA: Diagnosis not present

## 2020-02-11 DIAGNOSIS — L089 Local infection of the skin and subcutaneous tissue, unspecified: Secondary | ICD-10-CM | POA: Diagnosis not present

## 2020-03-16 DIAGNOSIS — R9389 Abnormal findings on diagnostic imaging of other specified body structures: Secondary | ICD-10-CM | POA: Diagnosis not present

## 2020-03-16 DIAGNOSIS — Z6827 Body mass index (BMI) 27.0-27.9, adult: Secondary | ICD-10-CM | POA: Diagnosis not present

## 2020-03-16 DIAGNOSIS — R1011 Right upper quadrant pain: Secondary | ICD-10-CM | POA: Diagnosis not present

## 2020-03-16 DIAGNOSIS — Q453 Other congenital malformations of pancreas and pancreatic duct: Secondary | ICD-10-CM | POA: Diagnosis not present

## 2020-03-16 DIAGNOSIS — R0781 Pleurodynia: Secondary | ICD-10-CM | POA: Diagnosis not present

## 2020-03-16 DIAGNOSIS — M5030 Other cervical disc degeneration, unspecified cervical region: Secondary | ICD-10-CM | POA: Diagnosis not present

## 2020-04-02 DIAGNOSIS — R1011 Right upper quadrant pain: Secondary | ICD-10-CM | POA: Diagnosis not present

## 2020-04-03 DIAGNOSIS — N281 Cyst of kidney, acquired: Secondary | ICD-10-CM | POA: Diagnosis not present

## 2020-04-03 DIAGNOSIS — K8689 Other specified diseases of pancreas: Secondary | ICD-10-CM | POA: Diagnosis not present

## 2020-04-04 DIAGNOSIS — N289 Disorder of kidney and ureter, unspecified: Secondary | ICD-10-CM | POA: Diagnosis not present

## 2020-04-04 DIAGNOSIS — K769 Liver disease, unspecified: Secondary | ICD-10-CM | POA: Diagnosis not present

## 2020-04-04 DIAGNOSIS — K8689 Other specified diseases of pancreas: Secondary | ICD-10-CM | POA: Diagnosis not present

## 2020-04-04 DIAGNOSIS — R1011 Right upper quadrant pain: Secondary | ICD-10-CM | POA: Diagnosis not present

## 2020-04-13 DIAGNOSIS — R109 Unspecified abdominal pain: Secondary | ICD-10-CM | POA: Diagnosis not present

## 2020-04-18 DIAGNOSIS — N281 Cyst of kidney, acquired: Secondary | ICD-10-CM | POA: Diagnosis not present

## 2020-04-18 DIAGNOSIS — K8689 Other specified diseases of pancreas: Secondary | ICD-10-CM | POA: Diagnosis not present

## 2020-04-19 DIAGNOSIS — Q453 Other congenital malformations of pancreas and pancreatic duct: Secondary | ICD-10-CM | POA: Diagnosis not present

## 2020-04-19 DIAGNOSIS — N281 Cyst of kidney, acquired: Secondary | ICD-10-CM | POA: Diagnosis not present

## 2020-04-19 DIAGNOSIS — N2889 Other specified disorders of kidney and ureter: Secondary | ICD-10-CM | POA: Diagnosis not present

## 2020-06-12 DIAGNOSIS — Z1231 Encounter for screening mammogram for malignant neoplasm of breast: Secondary | ICD-10-CM | POA: Diagnosis not present

## 2020-11-23 DIAGNOSIS — H25813 Combined forms of age-related cataract, bilateral: Secondary | ICD-10-CM | POA: Diagnosis not present

## 2020-11-23 DIAGNOSIS — H43811 Vitreous degeneration, right eye: Secondary | ICD-10-CM | POA: Diagnosis not present
